# Patient Record
Sex: Female | Born: 1935 | Race: White | Hispanic: No | Marital: Married | Smoking: Former smoker
Health system: Southern US, Community
[De-identification: ages and names within clinical notes are randomized; demographics above are authoritative.]

## PROBLEM LIST (undated history)

## (undated) DIAGNOSIS — I499 Cardiac arrhythmia, unspecified: Secondary | ICD-10-CM

## (undated) DIAGNOSIS — M810 Age-related osteoporosis without current pathological fracture: Secondary | ICD-10-CM

## (undated) DIAGNOSIS — N200 Calculus of kidney: Secondary | ICD-10-CM

## (undated) DIAGNOSIS — E7800 Pure hypercholesterolemia, unspecified: Secondary | ICD-10-CM

## (undated) DIAGNOSIS — Z972 Presence of dental prosthetic device (complete) (partial): Secondary | ICD-10-CM

## (undated) DIAGNOSIS — K219 Gastro-esophageal reflux disease without esophagitis: Secondary | ICD-10-CM

## (undated) DIAGNOSIS — J449 Chronic obstructive pulmonary disease, unspecified: Secondary | ICD-10-CM

## (undated) DIAGNOSIS — I10 Essential (primary) hypertension: Secondary | ICD-10-CM

## (undated) HISTORY — DX: Cardiac arrhythmia, unspecified: I49.9

## (undated) HISTORY — PX: KIDNEY STONE SURGERY: SHX686

## (undated) HISTORY — PX: EYE SURGERY: SHX253

## (undated) HISTORY — PX: ABDOMINAL HYSTERECTOMY: SHX81

## (undated) HISTORY — PX: HEMORRHOID SURGERY: SHX153

## (undated) HISTORY — PX: THYROID SURGERY: SHX805

---

## 2004-07-12 ENCOUNTER — Emergency Department: Payer: Self-pay | Admitting: Emergency Medicine

## 2004-07-15 ENCOUNTER — Other Ambulatory Visit: Payer: Self-pay

## 2004-10-07 ENCOUNTER — Ambulatory Visit: Payer: Self-pay | Admitting: Unknown Physician Specialty

## 2004-10-20 ENCOUNTER — Ambulatory Visit: Payer: Self-pay | Admitting: Unknown Physician Specialty

## 2005-12-02 ENCOUNTER — Ambulatory Visit: Payer: Self-pay | Admitting: Unknown Physician Specialty

## 2007-01-25 ENCOUNTER — Ambulatory Visit: Payer: Self-pay | Admitting: Unknown Physician Specialty

## 2007-01-26 ENCOUNTER — Ambulatory Visit: Payer: Self-pay | Admitting: Unknown Physician Specialty

## 2007-03-28 ENCOUNTER — Ambulatory Visit: Payer: Self-pay | Admitting: General Surgery

## 2007-03-28 HISTORY — PX: COLONOSCOPY: SHX174

## 2007-06-16 ENCOUNTER — Ambulatory Visit: Payer: Self-pay | Admitting: Unknown Physician Specialty

## 2007-07-04 ENCOUNTER — Ambulatory Visit: Payer: Self-pay | Admitting: Internal Medicine

## 2007-11-10 ENCOUNTER — Ambulatory Visit: Payer: Self-pay | Admitting: Unknown Physician Specialty

## 2007-11-28 ENCOUNTER — Ambulatory Visit: Payer: Self-pay | Admitting: Unknown Physician Specialty

## 2008-01-01 ENCOUNTER — Ambulatory Visit: Payer: Self-pay | Admitting: Gastroenterology

## 2008-01-05 ENCOUNTER — Ambulatory Visit: Payer: Self-pay | Admitting: Gastroenterology

## 2008-05-16 ENCOUNTER — Ambulatory Visit: Payer: Self-pay | Admitting: Unknown Physician Specialty

## 2008-08-16 ENCOUNTER — Ambulatory Visit: Payer: Self-pay

## 2008-09-12 ENCOUNTER — Ambulatory Visit: Payer: Self-pay | Admitting: Orthopedic Surgery

## 2008-09-17 ENCOUNTER — Ambulatory Visit: Payer: Self-pay | Admitting: Orthopedic Surgery

## 2009-07-08 ENCOUNTER — Ambulatory Visit: Payer: Self-pay | Admitting: Unknown Physician Specialty

## 2010-09-02 ENCOUNTER — Ambulatory Visit: Payer: Self-pay | Admitting: Unknown Physician Specialty

## 2011-09-23 ENCOUNTER — Ambulatory Visit: Payer: Self-pay | Admitting: Unknown Physician Specialty

## 2011-10-04 ENCOUNTER — Ambulatory Visit: Payer: Self-pay | Admitting: Unknown Physician Specialty

## 2012-12-28 ENCOUNTER — Ambulatory Visit: Payer: Self-pay | Admitting: Unknown Physician Specialty

## 2013-10-02 ENCOUNTER — Encounter: Payer: Self-pay | Admitting: General Surgery

## 2013-10-02 ENCOUNTER — Ambulatory Visit (INDEPENDENT_AMBULATORY_CARE_PROVIDER_SITE_OTHER): Payer: Medicare Other | Admitting: General Surgery

## 2013-10-02 VITALS — BP 130/70 | HR 76 | Resp 14 | Ht 67.0 in | Wt 195.0 lb

## 2013-10-02 DIAGNOSIS — K625 Hemorrhage of anus and rectum: Secondary | ICD-10-CM | POA: Insufficient documentation

## 2013-10-02 DIAGNOSIS — K648 Other hemorrhoids: Secondary | ICD-10-CM

## 2013-10-02 LAB — HEMOCCULT GUIAC POC 1CARD (OFFICE): Fecal Occult Blood, POC: NEGATIVE

## 2013-10-02 NOTE — Patient Instructions (Addendum)
The patient is aware to call back for any questions or concerns. If further bleeding then colonoscopy

## 2013-10-02 NOTE — Progress Notes (Signed)
Patient ID: Danielle Ruiz, female   DOB: 1933/11/08, 79 y.o.   MRN: [id number]  Chief Complaint  Patient presents with  . Other    Old Patient evaluation of rectal bleeding     HPI Danielle Ruiz is a 79 y.o. female.  Here today to evaluate rectal bleeding. She had one episode of rectal bleeding last week. Bright red blood (painless) identified in the toilet bowl on one occasion. No recurrence.   No pain with defecation. Granddaughter is a physician in [facility] and wants her to have a colonoscopy. Bowels move daily. Denies any other gastrointestinal issues. She feels like it is hemorrhoids. Colonoscopy in 2008 was notable for a hyperplastic polyp. HPI  Past Medical History  Diagnosis Date  . Irregular heart beat     Past Surgical History  Procedure Laterality Date  . Kidney stone surgery    . Abdominal hysterectomy    . Thyroid surgery    . Colonoscopy  03-28-2007     Dr Lemar Livings hyperplastic polyp removed  . Hemorrhoid surgery      Family History  Problem Relation Age of Onset  . Cancer Brother 60    colon    Social History History  Substance Use Topics  . Smoking status: Former Games developer  . Smokeless tobacco: Not on file  . Alcohol Use: Yes     Comment: wine    Allergies not on file  Current Outpatient Prescriptions  Medication Sig Dispense Refill  . amLODipine (NORVASC) 5 MG tablet Take 5 mg by mouth daily.       . fluticasone (FLONASE) 50 MCG/ACT nasal spray Place 1 spray into both nostrils daily.       . furosemide (LASIX) 20 MG tablet Take 20 mg by mouth.       . propranolol ER (INDERAL LA) 80 MG 24 hr capsule Take 80 mg by mouth daily.        No current facility-administered medications for this visit.    Review of Systems Review of Systems  Constitutional: Negative.   Respiratory: Negative.   Cardiovascular: Negative.   Gastrointestinal: Positive for anal bleeding. Negative for nausea, vomiting, abdominal pain and diarrhea.    Blood pressure 130/70,  pulse 76, resp. rate 14, height 5\' 7"  (1.702 m), weight 195 lb (88.451 kg).  Physical Exam Physical Exam  Constitutional: She is oriented to person, place, and time. She appears well-developed and well-nourished.  Eyes: No scleral icterus.  Neck: Neck supple.  Cardiovascular: Normal rate, regular rhythm and normal heart sounds.   Mild lower leg edema, right > left leg right leg 43 cm and left leg 41 cm.  Pulmonary/Chest: Effort normal and breath sounds normal.  Abdominal: Soft.  Genitourinary: Rectal exam shows internal hemorrhoid. Guaiac negative stool.  Internal hemorrhoid at 10 o'clock   Lymphadenopathy:    She has no cervical adenopathy.  Neurological: She is alert and oriented to person, place, and time.  Skin: Skin is warm and dry.    Data Reviewed 2008 colonoscopy: Mild diverticulosis of the sigmoid colon. 5 mm hyperplastic polyp in the rectum.  On endoscopy showed a 1 cm internal hemorrhoid with evidence of recent bleeding on the right anterior lower rectal wall. Stool is Hemoccult negative. Visualize lower rectum mucosa was unremarkable.  Assessment    Internal hemorrhoids, resolved.     Plan    I offered to contact the patient's granddaughter and review the case with her. She declined this. Her granddaughter is welcome to call review  recommendations: Observation alone unless recurrent bleeding occurs. The patient was strongly encouraged to report any changes in her bowels or recurrent episodes of bleeding which would indeed warrant early colonoscopy.        Danielle Ruiz 10/02/2013, 9:23 PM

## 2013-10-22 ENCOUNTER — Encounter: Payer: Self-pay | Admitting: General Surgery

## 2013-12-31 ENCOUNTER — Ambulatory Visit: Payer: Self-pay | Admitting: Physician Assistant

## 2014-08-12 ENCOUNTER — Encounter: Payer: Self-pay | Admitting: General Surgery

## 2015-02-27 ENCOUNTER — Other Ambulatory Visit: Payer: Self-pay | Admitting: Physician Assistant

## 2015-02-27 DIAGNOSIS — Z1231 Encounter for screening mammogram for malignant neoplasm of breast: Secondary | ICD-10-CM

## 2015-03-14 ENCOUNTER — Ambulatory Visit
Admission: RE | Admit: 2015-03-14 | Discharge: 2015-03-14 | Disposition: A | Discharge status: Home / Self Care | Payer: Medicare Other | Source: Ambulatory Visit | Attending: Physician Assistant | Admitting: Physician Assistant

## 2015-03-14 ENCOUNTER — Other Ambulatory Visit: Payer: Self-pay | Admitting: Physician Assistant

## 2015-03-14 DIAGNOSIS — Z1231 Encounter for screening mammogram for malignant neoplasm of breast: Secondary | ICD-10-CM

## 2015-05-15 ENCOUNTER — Ambulatory Visit (INDEPENDENT_AMBULATORY_CARE_PROVIDER_SITE_OTHER)
Admission: RE | Admit: 2015-05-15 | Discharge: 2015-05-15 | Disposition: A | Discharge status: Home / Self Care | Payer: Medicare Other | Source: Ambulatory Visit | Attending: Family Medicine | Admitting: Family Medicine

## 2015-05-15 ENCOUNTER — Encounter: Payer: Self-pay | Admitting: Family Medicine

## 2015-05-15 ENCOUNTER — Ambulatory Visit (INDEPENDENT_AMBULATORY_CARE_PROVIDER_SITE_OTHER): Payer: Medicare Other | Admitting: Family Medicine

## 2015-05-15 VITALS — BP 126/74 | HR 69 | Ht 67.0 in | Wt 193.0 lb

## 2015-05-15 DIAGNOSIS — M170 Bilateral primary osteoarthritis of knee: Secondary | ICD-10-CM | POA: Diagnosis not present

## 2015-05-15 DIAGNOSIS — M25562 Pain in left knee: Secondary | ICD-10-CM | POA: Diagnosis not present

## 2015-05-15 DIAGNOSIS — M25561 Pain in right knee: Secondary | ICD-10-CM | POA: Diagnosis not present

## 2015-05-15 NOTE — Assessment & Plan Note (Signed)
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 We discussed proper shoewear, icing regimen, topical anti-inflammatory's, over-the-counter natural supplementations, as well as home exercises. Patient with x-rays today for more of a baseline. I expect patient to make improvement but we discussed that she will not be pain-free. If patient continues to have pain at follow-up in 4 weeks she would be a candidate for injections.

## 2015-05-15 NOTE — Patient Instructions (Addendum)
Good to see you.  Ice 20 minutes 2 times daily. Usually after activity and before bed. Exercises 3 times a week.  Vitamin D 2000 IU daily Turmeric  daily Fish oil 2 grams daily pennsaid pinkie amount topically 2 times daily as needed.  Wera good shoes Good shoes with rigid bottom.  Danielle Ruiz, [place]rell or New balance greater then 700 See me again in 4 weeks and we will make sure you are better

## 2015-05-15 NOTE — Progress Notes (Signed)
  Zach Zoee Heeney D.O. [facility] [address] [place], [place] [address] Phone: [phone] Subjective:    I'm seeing this patient by the request  of:  MCLAUGHLIN, MIRIAM K, MD   CC: Bilateral knee pain.   BJY:[id number] Lavenia P Arel is a 81 y.o. female coming in with complaint of bilateral knee pain.  Been a long insidious onset.  No injury.  No instability or radiation worse at night, 6/10 in severity.  Can be sharp or throbbing sensation. Pain on inside of knees bilaterally.  Mild improvement with over-the counter medications. Patient still can do all daily activities and this is not stopping her but she would like to slow down the progression so she does not have any knee replacement in the future.  Past Medical History  Diagnosis Date  . Irregular heart beat    Past Surgical History  Procedure Laterality Date  . Kidney stone surgery    . Abdominal hysterectomy    . Thyroid surgery    . Colonoscopy  03-28-2007     Dr Lemar Livings hyperplastic polyp removed  . Hemorrhoid surgery     History  Substance Use Topics  . Smoking status: Former Games developer  . Smokeless tobacco: Not on file  . Alcohol Use: Yes     Comment: wine   ;No Known Allergies Family History  Problem Relation Age of Onset  . Cancer Brother 59    colon       Past medical history, social, surgical and family history all reviewed in electronic medical record.   Review of Systems: No headache, visual changes, nausea, vomiting, diarrhea, constipation, dizziness, abdominal pain, skin rash, fevers, chills, night sweats, weight loss, swollen lymph nodes, body aches, joint swelling, muscle aches, chest pain, shortness of breath, mood changes.   Objective Blood pressure 126/74, pulse 69, height  (1.702 m), weight 193 lb (87.544 kg), SpO2 96 %.  General: No apparent distress alert and oriented x3 mood and affect normal, dressed appropriately.  HEENT: Pupils equal, extraocular movements intact    Respiratory: Patient's speak in full sentences and does not appear short of breath  Cardiovascular: No lower extremity edema, non tender, no erythema  Skin: Warm dry intact with no signs of infection or rash on extremities or on axial skeleton.  Abdomen: Soft nontender  Neuro: Cranial nerves II through XII are intact, neurovascularly intact in all extremities with 2+ DTRs and 2+ pulses.  Lymph: No lymphadenopathy of posterior or anterior cervical chain or axillae bilaterally.  Gait normal with good balance and coordination.  MSK:  Non tender with full range of motion and good stability and symmetric strength and tone of shoulders, elbows, wrist, hip, and ankles bilaterally.  Knee: Bilateral Normal to inspection with no erythema or effusion or obvious bony abnormalities. Tender palpation of the medial joint line ROM full in flexion and extension and lower leg rotation. Ligaments with solid consistent endpoints including ACL, PCL, LCL, MCL. Negative Mcmurray's, Apley's, and Thessalonian tests. Non painful patellar compression. Patellar glide without crepitus. Patellar and quadriceps tendons unremarkable. Hamstring and quadriceps strength is normal.      Impression and Recommendations:     Tawana Scaleedical decision making of moderate complexity.

## 2015-05-15 NOTE — Progress Notes (Signed)
Pre visit review using our clinic review tool, if applicable. No additional management support is needed unless otherwise documented below in the visit note. 

## 2015-06-02 ENCOUNTER — Encounter: Payer: Self-pay | Admitting: *Deleted

## 2015-06-03 NOTE — Discharge Instructions (Signed)
[facility] °[facility] ° °POST OPERATIVE INSTRUCTIONS FOR DR. TROXLER AND DR. FOWLER °[facility] ° ° °1. Take your medication as prescribed.  Pain medication should be taken only as needed. ° °2. Keep the dressing clean, dry and intact. ° °3. Keep your foot elevated above the heart level for the first 48 hours. ° °4. Walking to the bathroom and brief periods of walking are acceptable, unless we have instructed you to be non-weight bearing. ° °5. Always wear your post-op shoe when walking.  Always use your crutches if you are to be non-weight bearing. ° °6. Do not take a shower. Baths are permissible as long as the foot is kept out of the water.  ° °7. Every hour you are awake:  °- Bend your knee 15 times. °- Flex foot 15 times °- Massage calf 15 times ° °8. Call [facility] ([phone]) if any of the following problems occur: °- You develop a temperature or fever. °- The bandage becomes saturated with blood. °- Medication does not stop your pain. °- Injury of the foot occurs. °- Any symptoms of infection including redness, odor, or red streaks running from wound. °-  ° °General Anesthesia, Care After °Refer to this sheet in the next few weeks. These instructions provide you with information on caring for yourself after your procedure. Your health care provider may also give you more specific instructions. Your treatment has been planned according to current medical practices, but problems sometimes occur. Call your health care provider if you have any problems or questions after your procedure. °WHAT TO EXPECT AFTER THE PROCEDURE °After the procedure, it is typical to experience: °· Sleepiness. °· Nausea and vomiting. °HOME CARE INSTRUCTIONS °· For the first 24 hours after general anesthesia: °¨ Have a responsible person with you. °¨ Do not drive a car. If you are alone, do not take public transportation. °¨ Do not drink alcohol. °¨ Do not take medicine  that has not been prescribed by your health care provider. °¨ Do not sign important papers or make important decisions. °¨ You may resume a normal diet and activities as directed by your health care provider. °· Change bandages (dressings) as directed. °· If you have questions or problems that seem related to general anesthesia, call the hospital and ask for the anesthetist or anesthesiologist on call. °SEEK MEDICAL CARE IF: °· You have nausea and vomiting that continue the day after anesthesia. °· You develop a rash. °SEEK IMMEDIATE MEDICAL CARE IF:  °· You have difficulty breathing. °· You have chest pain. °· You have any allergic problems. °Document Released: 01/03/2001 Document Revised: 10/02/2013 Document Reviewed: 04/12/2013 °ExitCare® Patient Information ©2015 [employer]. This information is not intended to replace advice given to you by your health care provider. Make sure you discuss any questions you have with your health care provider. ° °

## 2015-06-05 ENCOUNTER — Ambulatory Visit
Admission: RE | Admit: 2015-06-05 | Discharge: 2015-06-05 | Disposition: A | Discharge status: Home / Self Care | Payer: Medicare Other | Source: Ambulatory Visit | Attending: Podiatry | Admitting: Podiatry

## 2015-06-05 ENCOUNTER — Ambulatory Visit: Payer: Medicare Other | Admitting: Anesthesiology

## 2015-06-05 ENCOUNTER — Encounter
Admission: RE | Disposition: A | Discharge status: Home / Self Care | Payer: Self-pay | Source: Ambulatory Visit | Attending: Podiatry

## 2015-06-05 DIAGNOSIS — Z87442 Personal history of urinary calculi: Secondary | ICD-10-CM | POA: Insufficient documentation

## 2015-06-05 DIAGNOSIS — E780 Pure hypercholesterolemia: Secondary | ICD-10-CM | POA: Insufficient documentation

## 2015-06-05 DIAGNOSIS — M2042 Other hammer toe(s) (acquired), left foot: Secondary | ICD-10-CM

## 2015-06-05 DIAGNOSIS — M2041 Other hammer toe(s) (acquired), right foot: Secondary | ICD-10-CM | POA: Diagnosis present

## 2015-06-05 DIAGNOSIS — K219 Gastro-esophageal reflux disease without esophagitis: Secondary | ICD-10-CM | POA: Diagnosis not present

## 2015-06-05 DIAGNOSIS — Z90710 Acquired absence of both cervix and uterus: Secondary | ICD-10-CM | POA: Insufficient documentation

## 2015-06-05 DIAGNOSIS — J449 Chronic obstructive pulmonary disease, unspecified: Secondary | ICD-10-CM | POA: Insufficient documentation

## 2015-06-05 DIAGNOSIS — E892 Postprocedural hypoparathyroidism: Secondary | ICD-10-CM | POA: Diagnosis not present

## 2015-06-05 DIAGNOSIS — G8929 Other chronic pain: Secondary | ICD-10-CM | POA: Insufficient documentation

## 2015-06-05 DIAGNOSIS — Z87891 Personal history of nicotine dependence: Secondary | ICD-10-CM | POA: Diagnosis not present

## 2015-06-05 DIAGNOSIS — I10 Essential (primary) hypertension: Secondary | ICD-10-CM | POA: Insufficient documentation

## 2015-06-05 DIAGNOSIS — Z79899 Other long term (current) drug therapy: Secondary | ICD-10-CM | POA: Insufficient documentation

## 2015-06-05 DIAGNOSIS — M810 Age-related osteoporosis without current pathological fracture: Secondary | ICD-10-CM | POA: Insufficient documentation

## 2015-06-05 DIAGNOSIS — Z888 Allergy status to other drugs, medicaments and biological substances status: Secondary | ICD-10-CM | POA: Insufficient documentation

## 2015-06-05 DIAGNOSIS — I493 Ventricular premature depolarization: Secondary | ICD-10-CM | POA: Insufficient documentation

## 2015-06-05 HISTORY — DX: Calculus of kidney: N20.0

## 2015-06-05 HISTORY — DX: Essential (primary) hypertension: I10

## 2015-06-05 HISTORY — DX: Presence of dental prosthetic device (complete) (partial): Z97.2

## 2015-06-05 HISTORY — DX: Cardiac arrhythmia, unspecified: I49.9

## 2015-06-05 HISTORY — DX: Pure hypercholesterolemia, unspecified: E78.00

## 2015-06-05 HISTORY — DX: Gastro-esophageal reflux disease without esophagitis: K21.9

## 2015-06-05 HISTORY — PX: HAMMER TOE SURGERY: SHX385

## 2015-06-05 HISTORY — DX: Chronic obstructive pulmonary disease, unspecified: J44.9

## 2015-06-05 HISTORY — DX: Age-related osteoporosis without current pathological fracture: M81.0

## 2015-06-05 SURGERY — CORRECTION, HAMMER TOE
Anesthesia: Monitor Anesthesia Care | Laterality: Left | Wound class: Clean

## 2015-06-05 MED ORDER — LACTATED RINGERS IV SOLN
INTRAVENOUS | Status: DC
  Administered 2015-06-05: 07:00:00 via INTRAVENOUS

## 2015-06-05 MED ORDER — HYDROCODONE-ACETAMINOPHEN 5-325 MG PO TABS: 1.0000 | ORAL_TABLET | Freq: Four times a day (QID) | ORAL | Status: DC | PRN

## 2015-06-05 MED ORDER — LIDOCAINE HCL (CARDIAC) 20 MG/ML IV SOLN
INTRAVENOUS | Status: DC | PRN
Start: 2015-06-05 — End: 2015-06-05
  Administered 2015-06-05: 40 mg via INTRAVENOUS

## 2015-06-05 MED ORDER — MIDAZOLAM HCL 2 MG/2ML IJ SOLN
INTRAMUSCULAR | Status: DC | PRN
  Administered 2015-06-05: 2 mg via INTRAVENOUS

## 2015-06-05 MED ORDER — PROPOFOL INFUSION 10 MG/ML OPTIME
INTRAVENOUS | Status: DC | PRN
  Administered 2015-06-05: 50 ug/kg/min via INTRAVENOUS

## 2015-06-05 MED ORDER — OXYCODONE HCL 5 MG PO TABS: 5.0000 mg | ORAL_TABLET | Freq: Once | ORAL | Status: DC | PRN

## 2015-06-05 MED ORDER — CEFAZOLIN SODIUM-DEXTROSE 2-3 GM-% IV SOLR
2.0000 g | Freq: Once | INTRAVENOUS | Status: AC
  Administered 2015-06-05: 2 g via INTRAVENOUS

## 2015-06-05 MED ORDER — OXYCODONE HCL 5 MG/5ML PO SOLN: 5.0000 mg | Freq: Once | ORAL | Status: DC | PRN

## 2015-06-05 MED ORDER — PROPOFOL 10 MG/ML IV BOLUS
INTRAVENOUS | Status: DC | PRN
  Administered 2015-06-05: 50 mg via INTRAVENOUS

## 2015-06-05 MED ORDER — BUPIVACAINE HCL (PF) 0.5 % IJ SOLN
INTRAMUSCULAR | Status: DC | PRN
  Administered 2015-06-05 (×2): 2 mL

## 2015-06-05 MED ORDER — FENTANYL CITRATE (PF) 100 MCG/2ML IJ SOLN
INTRAMUSCULAR | Status: DC | PRN
  Administered 2015-06-05 (×2): 50 ug via INTRAVENOUS

## 2015-06-05 SURGICAL SUPPLY — 66 items
BANDAGE ELASTIC 4 CLIP NS LF (GAUZE/BANDAGES/DRESSINGS) ×3 IMPLANT
BENZOIN TINCTURE PRP APPL 2/3 (GAUZE/BANDAGES/DRESSINGS) IMPLANT
BLADE CRESCENTIC (BLADE) IMPLANT
BLADE MED AGGRESSIVE (BLADE) IMPLANT
BLADE MINI RND TIP GREEN BEAV (BLADE) IMPLANT
BLADE OSC/SAGITTAL 5.5X25 (BLADE) IMPLANT
BLADE OSC/SAGITTAL MD 5.5X18 (BLADE) ×3 IMPLANT
BLADE OSC/SAGITTAL MD 9X18.5 (BLADE) IMPLANT
BLADE OSCILLATING/SAGITTAL (BLADE)
BLADE SURG 15 STRL LF DISP TIS (BLADE) IMPLANT
BLADE SURG 15 STRL SS (BLADE)
BLADE SW THK.38XMED LNG THN (BLADE) IMPLANT
BNDG ESMARK 4X12 TAN STRL LF (GAUZE/BANDAGES/DRESSINGS) ×3 IMPLANT
BNDG GAUZE 4.5X4.1 6PLY STRL (MISCELLANEOUS) ×3 IMPLANT
BNDG STRETCH 4X75 STRL LF (GAUZE/BANDAGES/DRESSINGS) ×3 IMPLANT
BUR EGG 4X8 MED (BURR) IMPLANT
BUR STRYKR EGG 5.0 (BURR) IMPLANT
CANISTER SUCT 1200ML W/VALVE (MISCELLANEOUS) ×3 IMPLANT
CAST PADDING 3X4FT ST 30246 (SOFTGOODS)
CLOSURE WOUND 1/4X4 (GAUZE/BANDAGES/DRESSINGS)
COVER PIN YLW 0.028-062 (MISCELLANEOUS) IMPLANT
CUFF TOURN SGL QUICK 18 (TOURNIQUET CUFF) ×3 IMPLANT
DRAPE FLUOR MINI C-ARM 54X84 (DRAPES) ×3 IMPLANT
DRILL WIRE PASS (DRILL) IMPLANT
DURAPREP 26ML APPLICATOR (WOUND CARE) ×3 IMPLANT
ETHIBOND 2 0 GREEN CT 2 30IN (SUTURE) IMPLANT
FIXATION HAMMERTOE ANGLD 15MM (Toe) ×1 IMPLANT
GAUZE PETRO XEROFOAM 1X8 (MISCELLANEOUS) ×3 IMPLANT
GAUZE PETRO XEROFOAM 5X9 (MISCELLANEOUS) IMPLANT
GAUZE SPONGE 4X4 12PLY STRL (GAUZE/BANDAGES/DRESSINGS) ×3 IMPLANT
GLOVE BIO SURGEON STRL SZ7.5 (GLOVE) ×3 IMPLANT
GLOVE BIO SURGEON STRL SZ8 (GLOVE) ×3 IMPLANT
GLOVE INDICATOR 8.0 STRL GRN (GLOVE) ×3 IMPLANT
GOWN STRL REUS W/ TWL XL LVL3 (GOWN DISPOSABLE) ×2 IMPLANT
GOWN STRL REUS W/TWL XL LVL3 (GOWN DISPOSABLE) ×4
HAMMERTOE ANGLED 15MM 5MM (Toe) ×3 IMPLANT
K-WIRE DBL END TROCAR 6X.045 (WIRE)
K-WIRE DBL END TROCAR 6X.062 (WIRE)
KWIRE DBL END TROCAR 6X.045 (WIRE) IMPLANT
KWIRE DBL END TROCAR 6X.062 (WIRE) IMPLANT
NEEDLE HYPO 18GX1.5 BLUNT FILL (NEEDLE) ×3 IMPLANT
NEEDLE HYPO 25GX1X1/2 BEV (NEEDLE) ×3 IMPLANT
PACK EXTREMITY ARMC (MISCELLANEOUS) ×3 IMPLANT
PAD CAST CTTN 3X4 STRL (SOFTGOODS) IMPLANT
PAD GROUND ADULT SPLIT (MISCELLANEOUS) ×3 IMPLANT
RASP SM TEAR CROSS CUT (RASP) ×3 IMPLANT
SPLINT CAST 1 STEP 4X30 (MISCELLANEOUS) ×3 IMPLANT
SPLINT FAST PLASTER 5X30 (CAST SUPPLIES)
SPLINT PLASTER CAST FAST 5X30 (CAST SUPPLIES) IMPLANT
STOCKINETTE STRL 6IN 960660 (GAUZE/BANDAGES/DRESSINGS) ×3 IMPLANT
STRIP CLOSURE SKIN 1/4X4 (GAUZE/BANDAGES/DRESSINGS) IMPLANT
SUT ETHILON 4-0 (SUTURE)
SUT ETHILON 4-0 FS2 18XMFL BLK (SUTURE)
SUT ETHILON 5-0 FS-2 18 BLK (SUTURE) ×3 IMPLANT
SUT VIC AB 1 CT1 36 (SUTURE) IMPLANT
SUT VIC AB 2-0 CT1 27 (SUTURE)
SUT VIC AB 2-0 CT1 TAPERPNT 27 (SUTURE) IMPLANT
SUT VIC AB 2-0 SH 27 (SUTURE)
SUT VIC AB 2-0 SH 27XBRD (SUTURE) IMPLANT
SUT VIC AB 3-0 SH 27 (SUTURE)
SUT VIC AB 3-0 SH 27X BRD (SUTURE) IMPLANT
SUT VIC AB 4-0 FS2 27 (SUTURE) ×3 IMPLANT
SUT VICRYL AB 3-0 FS1 BRD 27IN (SUTURE) IMPLANT
SUTURE ETHLN 4-0 FS2 18XMF BLK (SUTURE) IMPLANT
SYRINGE 10CC LL (SYRINGE) ×3 IMPLANT
WATER STERILE IRR 1000ML POUR (IV SOLUTION) ×3 IMPLANT

## 2015-06-05 NOTE — Op Note (Signed)
Operative note   Surgeon: Dr. Recardo Evangelist, DPM.    Assistant: None    Preop diagnosis: Hammertoe deformity second toe left foot    Postop diagnosis: Same    Procedure:   1. Hammertoe correction second toe left foot with PIPJ fusion using hammerlock implant           EBL: Negligible less than 5 cc    Anesthesia:IV sedation with local block    Hemostasis: Ankle tourniquet 250 mmHg pressure    Specimen: None    Complications: None    Operative indications: Chronic pain and structural deformity to the left second toe unresponsive to conservative care    Procedure:  Patient was brought into the OR and placed on the operating table in thesupine position. After anesthesia was obtained theleft lower extremity was prepped and draped in usual sterile fashion.  Operative Report: At this time attention directed to the second toe of the left foot where 2 semielliptical incisions were made over the PIP joint. This ellipse of skin was then removed and the extensor tendon was identified and incised transversely and reflected proximally. The extensor tendon over the middle phalanx was dissected and retracted distally to expose the articular cartilage on the middle phalanx. This point a power saw was used to remove the articular cartilage from both the proximal phalanx head and middle phalanx base. At this time the area was irrigated checked FluoroScan good positioning location of the cuts were noted. The medullary canals for both the middle phalanx and proximal phalanx were then prepared utilizing materials from the hammerlock set. Once this was achieved the implant was placed and proximally released and then fitted to the middle phalanx. Care was taken at this time to make sure no soft tissue impingement was in between the bone portions. There is checked FluoroScan good position correction were noted implant position was excellent. After She irrigation the extensor tendon was then reapproximated  with 4 Vicryl simple interrupted sutures. Skin closed with 5-0 nylon horizontal mattress sutures. Toes checked this point seen to sit in a much more rectus alignment. The level of position of the toes was pretty accurate 1 through 5 with second toe sitting up slightly higher. I feel like once she starts to utilize the toe more with the fusion the flexor tendon will help bring the toe down in a much better position. At this time there is black 0.5% Marcaine plain and a sterile compressive dressing was placed across wound consisting of  Xeroform gauze 4 x 4's Kling and Kerlix.     Patient tolerated the procedure and anesthesia well.  Was transported from the OR to the PACU with all vital signs stable and vascular status intact. To be discharged per routine protocol.  Will follow up in approximately 1 week in the outpatient clinic.

## 2015-06-05 NOTE — Anesthesia Preprocedure Evaluation (Signed)
Anesthesia Evaluation  Patient identified by MRN, date of birth, ID band  Reviewed: NPO status   History of Anesthesia Complications Negative for: history of anesthetic complications  Airway Mallampati: II  TM Distance: >3 FB Neck ROM: full    Dental  (+) Upper Dentures, Lower Dentures   Pulmonary neg COPD (denies)former smoker,    Pulmonary exam normal       Cardiovascular Exercise Tolerance: Good hypertension, Normal cardiovascular exam+ dysrhythmias (pvc)     Neuro/Psych Anxiety negative neurological ROS     GI/Hepatic Neg liver ROS, GERD-  Controlled,  Endo/Other  negative endocrine ROS  Renal/GU Renal disease (kidney stones)  negative genitourinary   Musculoskeletal   Abdominal   Peds  Hematology negative hematology ROS (+)   Anesthesia Other Findings   Reproductive/Obstetrics                             Anesthesia Physical Anesthesia Plan  ASA: II  Anesthesia Plan: MAC   Post-op Pain Management:    Induction:   Airway Management Planned:   Additional Equipment:   Intra-op Plan:   Post-operative Plan:   Informed Consent: I have reviewed the patients History and Physical, chart, labs and discussed the procedure including the risks, benefits and alternatives for the proposed anesthesia with the patient or authorized representative who has indicated his/her understanding and acceptance.     Plan Discussed with: CRNA  Anesthesia Plan Comments:         Anesthesia Quick Evaluation

## 2015-06-05 NOTE — Anesthesia Postprocedure Evaluation (Signed)
  Anesthesia Post-op Note  Patient: Danielle Ruiz  Procedure(s) Performed: Procedure(s) with comments: HAMMER TOE CORRECTION WITH TENDON LENGTHENING (Left) - SEDATION WITH LOCAL  Anesthesia type:MAC  Patient location: PACU  Post pain: Pain level controlled  Post assessment: Post-op Vital signs reviewed, Patient's Cardiovascular Status Stable, Respiratory Function Stable, Patent Airway and No signs of Nausea or vomiting  Post vital signs: Reviewed and stable  Last Vitals:  Filed Vitals:   06/05/15 0856  BP:   Pulse: 63  Temp:   Resp: 19    Level of consciousness: awake, alert  and patient cooperative  Complications: No apparent anesthesia complications

## 2015-06-05 NOTE — Transfer of Care (Signed)
Immediate Anesthesia Transfer of Care Note  Patient: Danielle Ruiz  Procedure(s) Performed: Procedure(s) with comments: HAMMER TOE CORRECTION WITH TENDON LENGTHENING (Left) - SEDATION WITH LOCAL  Patient Location: PACU  Anesthesia Type: MAC  Level of Consciousness: awake, alert  and patient cooperative  Airway and Oxygen Therapy: Patient Spontanous Breathing and Patient connected to supplemental oxygen  Post-op Assessment: Post-op Vital signs reviewed, Patient's Cardiovascular Status Stable, Respiratory Function Stable, Patent Airway and No signs of Nausea or vomiting  Post-op Vital Signs: Reviewed and stable  Complications: No apparent anesthesia complications

## 2015-06-05 NOTE — Anesthesia Procedure Notes (Signed)
Procedure Name: MAC Performed by: Ishia Tenorio Pre-anesthesia Checklist: Patient identified, Emergency Drugs available, Suction available, Timeout performed and Patient being monitored Patient Re-evaluated:Patient Re-evaluated prior to inductionOxygen Delivery Method: Simple face mask Placement Confirmation: positive ETCO2       

## 2015-06-06 ENCOUNTER — Encounter: Payer: Self-pay | Admitting: Podiatry

## 2015-06-11 ENCOUNTER — Ambulatory Visit: Payer: Medicare Other | Admitting: Family Medicine

## 2015-06-12 ENCOUNTER — Ambulatory Visit: Payer: Medicare Other | Admitting: Family Medicine

## 2015-10-16 DIAGNOSIS — J310 Chronic rhinitis: Secondary | ICD-10-CM | POA: Insufficient documentation

## 2016-04-05 DIAGNOSIS — M20031 Swan-neck deformity of right finger(s): Secondary | ICD-10-CM | POA: Insufficient documentation

## 2016-06-08 ENCOUNTER — Other Ambulatory Visit: Payer: Self-pay | Admitting: Physician Assistant

## 2016-06-08 DIAGNOSIS — Z1231 Encounter for screening mammogram for malignant neoplasm of breast: Secondary | ICD-10-CM

## 2016-06-25 ENCOUNTER — Ambulatory Visit
Admission: RE | Admit: 2016-06-25 | Discharge: 2016-06-25 | Disposition: A | Discharge status: Home / Self Care | Payer: Medicare PPO | Source: Ambulatory Visit | Attending: Physician Assistant | Admitting: Physician Assistant

## 2016-06-25 ENCOUNTER — Other Ambulatory Visit: Payer: Self-pay | Admitting: Physician Assistant

## 2016-06-25 DIAGNOSIS — Z1231 Encounter for screening mammogram for malignant neoplasm of breast: Secondary | ICD-10-CM | POA: Insufficient documentation

## 2016-07-16 ENCOUNTER — Other Ambulatory Visit: Payer: Self-pay | Admitting: Orthopedic Surgery

## 2016-07-16 DIAGNOSIS — M79644 Pain in right finger(s): Secondary | ICD-10-CM

## 2016-07-27 ENCOUNTER — Other Ambulatory Visit: Payer: Medicare Other

## 2016-07-27 ENCOUNTER — Ambulatory Visit
Admission: RE | Admit: 2016-07-27 | Discharge: 2016-07-27 | Disposition: A | Discharge status: Home / Self Care | Payer: Medicare PPO | Source: Ambulatory Visit | Attending: Orthopedic Surgery | Admitting: Orthopedic Surgery

## 2016-07-27 DIAGNOSIS — M79644 Pain in right finger(s): Secondary | ICD-10-CM

## 2017-01-06 IMAGING — MR MR [PERSON_NAME]*[PERSON_NAME]* W/O CM
5 series · 40 of 40 positions shown · non-contrast
Comparison: None.

CLINICAL DATA: Right middle finger pain. Unable to bend the finger.

EXAM:
MRI OF THE RIGHT FINGERS WITHOUT CONTRAST
TECHNIQUE: Multiplanar, multisequence MR imaging was performed. No intravenous
contrast was administered.

[Series 4: T2 fat-sat · axial · right · 4.0mm · 0.31mm/px · z∈[-59,+60]mm · 8 of 32 slices shown (1 of 3)]
[im 1/32]
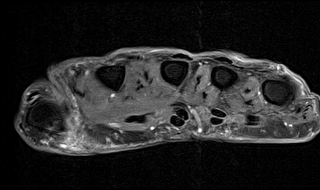
[im 5/32]
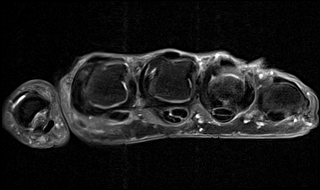
[im 9/32]
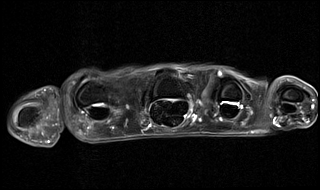
[im 14/32]
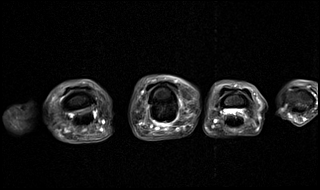
[im 18/32]
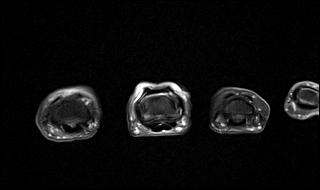
[im 23/32]
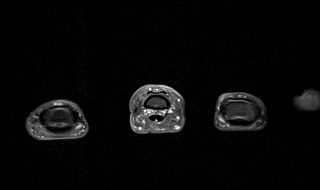
[im 27/32]
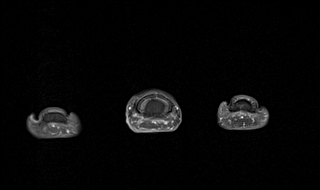
[im 32/32]
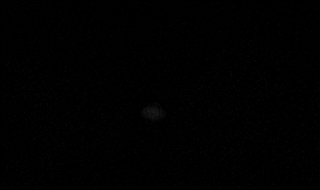

[Series 5: T2 fat-sat · coronal · right · 1.0mm · 0.23mm/px · 7 of 24 slices shown (2 of 3)]
[im 1/24]
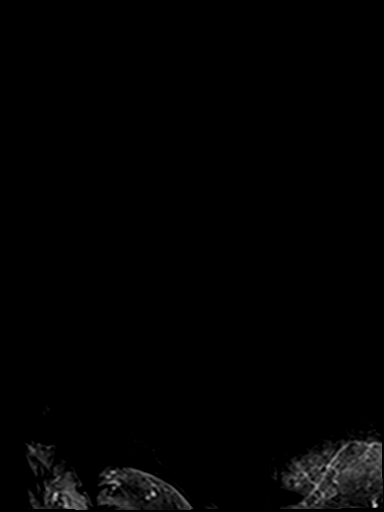
[im 4/24]
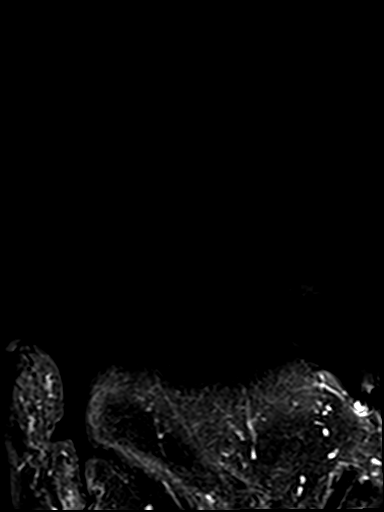
[im 8/24]
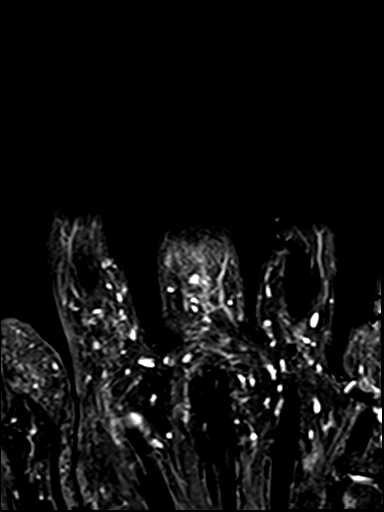
[im 12/24]
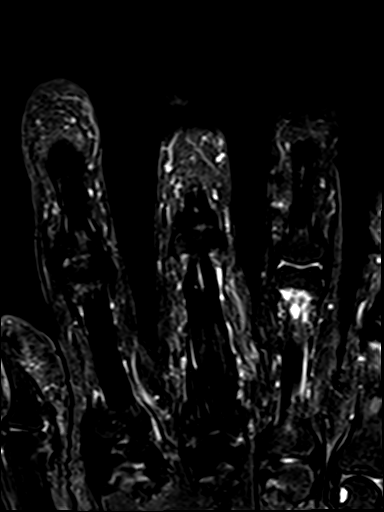
[im 16/24]
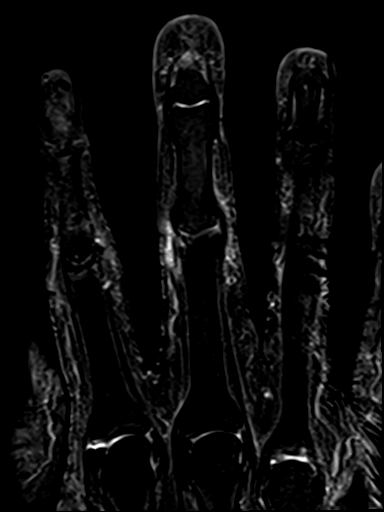
[im 20/24]
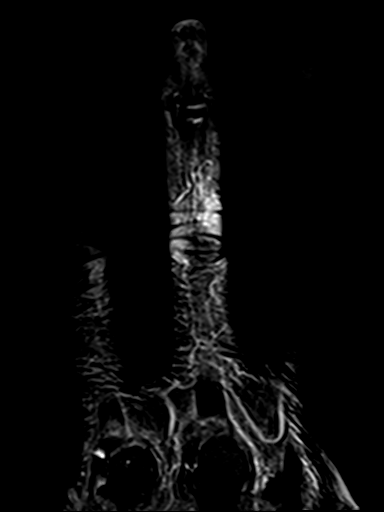
[im 24/24]
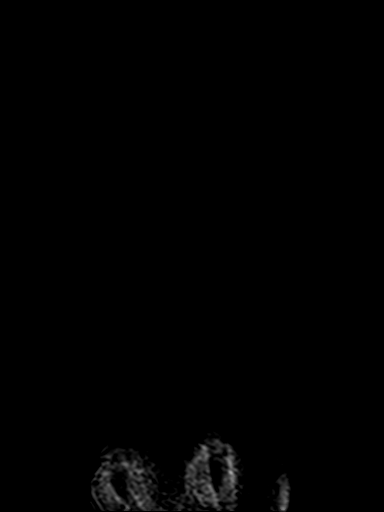

[Series 6: PD fat-sat · sagittal · right · 1.0mm · 0.51mm/px · 8 of 29 slices shown]
[im 1/29]
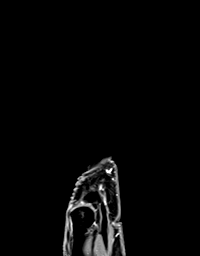
[im 5/29]
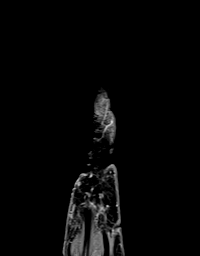
[im 9/29]
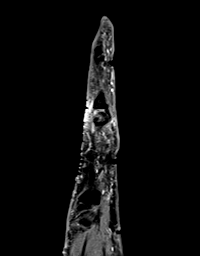
[im 13/29]
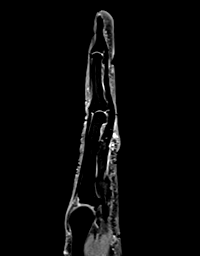
[im 17/29]
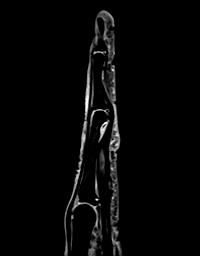
[im 21/29]
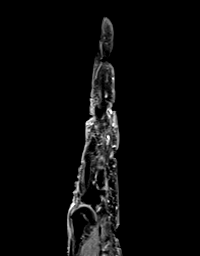
[im 25/29]
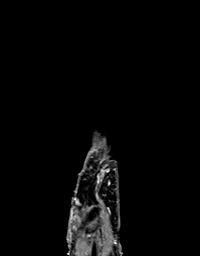
[im 29/29]
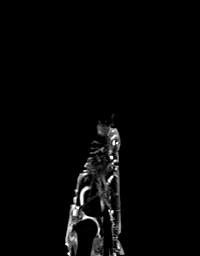

[Series 7: t1_ax · axial · right · 4.0mm · 0.31mm/px · z∈[-59,+60]mm · 9 of 32 slices shown]
[im 1/32]
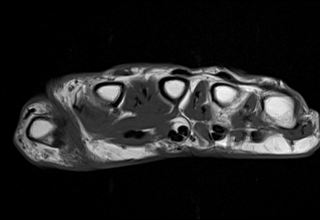
[im 4/32]
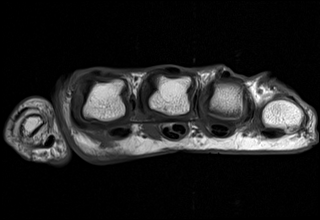
[im 8/32]
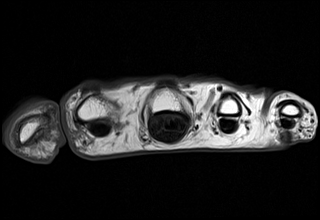
[im 12/32]
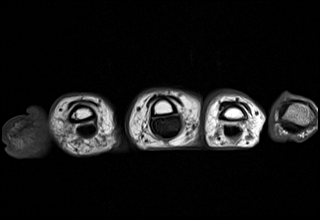
[im 16/32]
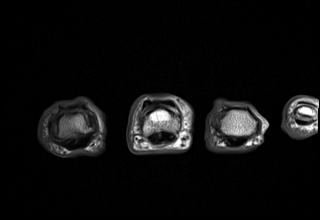
[im 20/32]
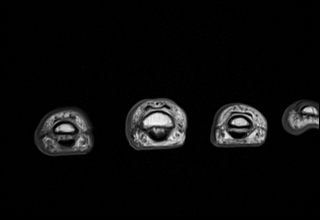
[im 24/32]
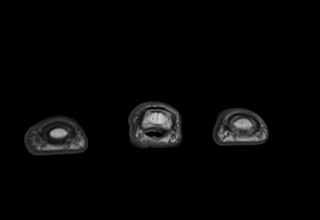
[im 28/32]
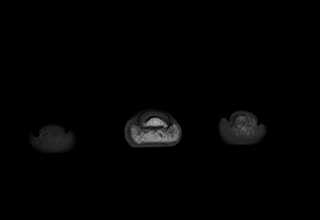
[im 32/32]
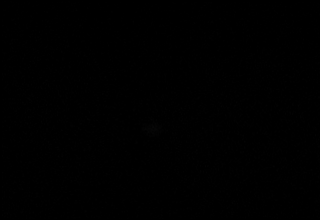

[Series 8: T2 fat-sat · sagittal · right · 1.0mm · 0.25mm/px · 8 of 29 slices shown (3 of 3)]
[im 1/29]
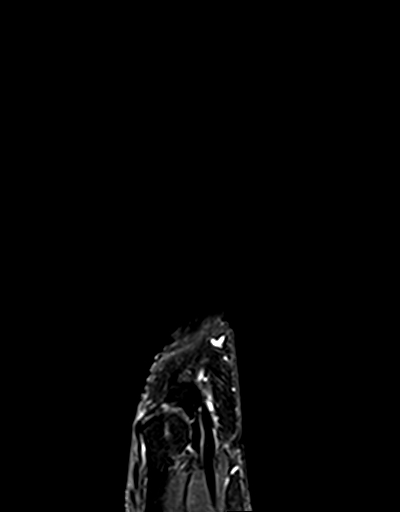
[im 5/29]
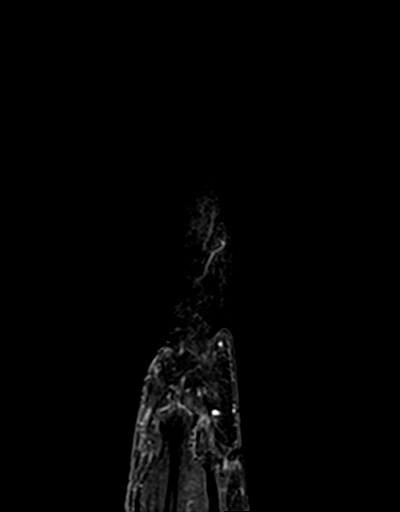
[im 9/29]
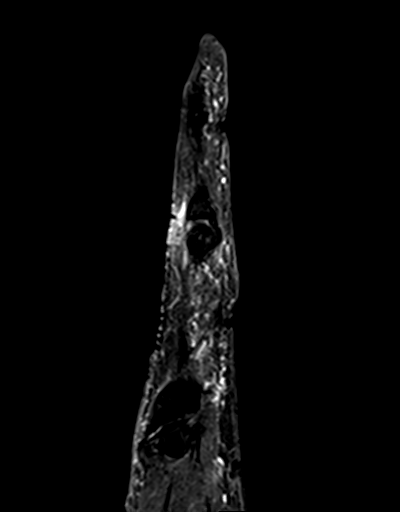
[im 13/29]
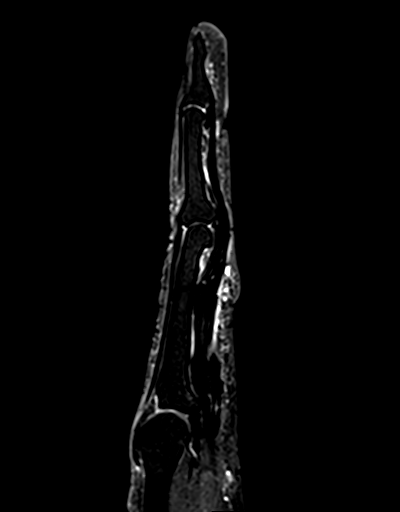
[im 17/29]
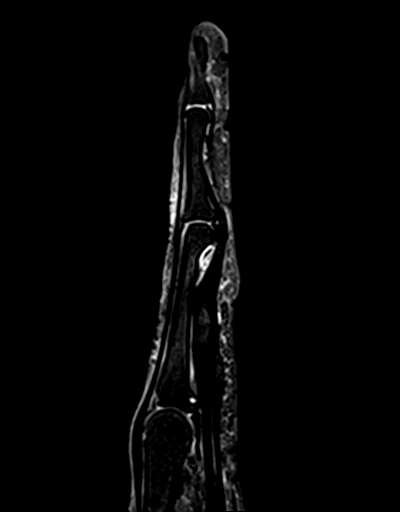
[im 21/29]
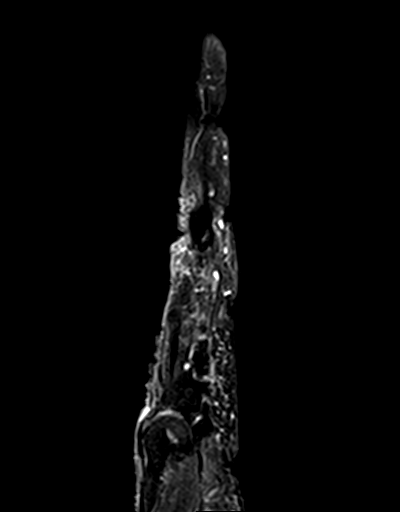
[im 25/29]
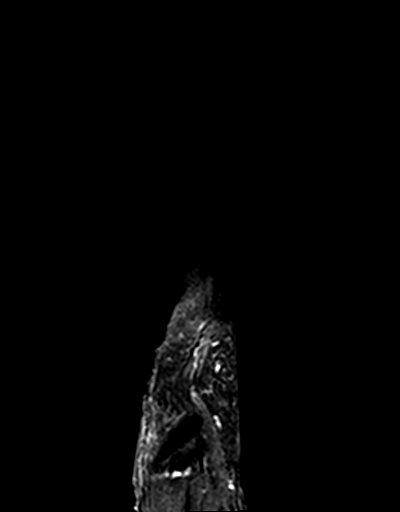
[im 29/29]
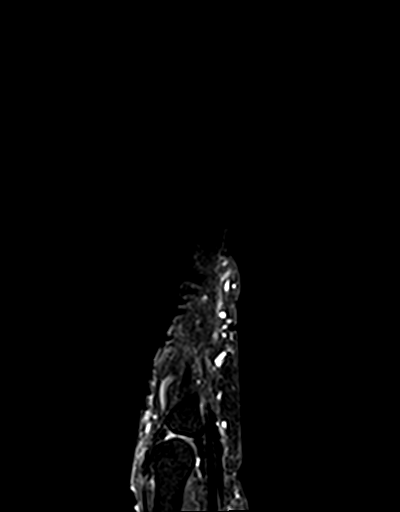

[40 of 40 positions shown; findings below may reference images not displayed]

FINDINGS: Bones/Joint/Cartilage

No marrow signal abnormality. No fracture or dislocation. Normal
alignment. No joint effusion.

Ligaments

Collateral ligaments are intact.

Muscles and Tendons
Extensor compartment tendons are normal. Severe tendinosis of the
flexor digitorum superficialis of the third digit at the level of
the third proximal phalanx. Focal severe tendinosis of the flexor
digitorum profundus at the level of the base of the for third
proximal phalanx.

Soft tissue
No fluid collection or hematoma.  No soft tissue mass.
IMPRESSION: 1. Severe tendinosis of the flexor digitorum superficialis of the
third digit at the level of the third proximal phalanx. Focal severe
tendinosis of the flexor digitorum profundus at the level of the
base of the for third proximal phalanx. No complete tear of the
flexor compartment tendons.

## 2017-05-30 ENCOUNTER — Other Ambulatory Visit: Payer: Self-pay | Admitting: Physician Assistant

## 2017-06-01 ENCOUNTER — Other Ambulatory Visit: Payer: Self-pay | Admitting: Physician Assistant

## 2017-06-01 DIAGNOSIS — Z1239 Encounter for other screening for malignant neoplasm of breast: Secondary | ICD-10-CM

## 2017-06-29 ENCOUNTER — Ambulatory Visit
Admission: RE | Admit: 2017-06-29 | Discharge: 2017-06-29 | Disposition: A | Discharge status: Home / Self Care | Payer: Medicare HMO | Source: Ambulatory Visit | Attending: Physician Assistant | Admitting: Physician Assistant

## 2017-06-29 DIAGNOSIS — Z1231 Encounter for screening mammogram for malignant neoplasm of breast: Secondary | ICD-10-CM | POA: Insufficient documentation

## 2017-06-29 DIAGNOSIS — Z1239 Encounter for other screening for malignant neoplasm of breast: Secondary | ICD-10-CM

## 2017-07-11 ENCOUNTER — Other Ambulatory Visit: Payer: Self-pay | Admitting: Physician Assistant

## 2017-07-11 DIAGNOSIS — R928 Other abnormal and inconclusive findings on diagnostic imaging of breast: Secondary | ICD-10-CM

## 2017-07-11 DIAGNOSIS — N6489 Other specified disorders of breast: Secondary | ICD-10-CM

## 2017-07-14 ENCOUNTER — Ambulatory Visit
Admission: RE | Admit: 2017-07-14 | Discharge: 2017-07-14 | Disposition: A | Discharge status: Home / Self Care | Payer: Medicare HMO | Source: Ambulatory Visit | Attending: Physician Assistant | Admitting: Physician Assistant

## 2017-07-14 DIAGNOSIS — N6489 Other specified disorders of breast: Secondary | ICD-10-CM | POA: Diagnosis not present

## 2017-07-14 DIAGNOSIS — R928 Other abnormal and inconclusive findings on diagnostic imaging of breast: Secondary | ICD-10-CM

## 2017-10-20 ENCOUNTER — Other Ambulatory Visit: Payer: Self-pay | Admitting: Physician Assistant

## 2017-10-20 DIAGNOSIS — M5416 Radiculopathy, lumbar region: Secondary | ICD-10-CM

## 2017-10-20 DIAGNOSIS — M79605 Pain in left leg: Secondary | ICD-10-CM

## 2017-11-10 ENCOUNTER — Other Ambulatory Visit: Payer: Self-pay

## 2017-11-10 ENCOUNTER — Emergency Department
Admission: EM | Admit: 2017-11-10 | Discharge: 2017-11-10 | Disposition: A | Discharge status: Home / Self Care | Payer: Medicare HMO | Attending: Emergency Medicine | Admitting: Emergency Medicine

## 2017-11-10 DIAGNOSIS — T6291XA Toxic effect of unspecified noxious substance eaten as food, accidental (unintentional), initial encounter: Secondary | ICD-10-CM | POA: Diagnosis not present

## 2017-11-10 DIAGNOSIS — J449 Chronic obstructive pulmonary disease, unspecified: Secondary | ICD-10-CM | POA: Diagnosis not present

## 2017-11-10 DIAGNOSIS — IMO0001 Reserved for inherently not codable concepts without codable children: Secondary | ICD-10-CM

## 2017-11-10 DIAGNOSIS — Z79899 Other long term (current) drug therapy: Secondary | ICD-10-CM | POA: Insufficient documentation

## 2017-11-10 DIAGNOSIS — Z7982 Long term (current) use of aspirin: Secondary | ICD-10-CM | POA: Diagnosis not present

## 2017-11-10 DIAGNOSIS — Z87891 Personal history of nicotine dependence: Secondary | ICD-10-CM | POA: Diagnosis not present

## 2017-11-10 DIAGNOSIS — I10 Essential (primary) hypertension: Secondary | ICD-10-CM | POA: Insufficient documentation

## 2017-11-10 LAB — URINALYSIS, COMPLETE (UACMP) WITH MICROSCOPIC
BILIRUBIN URINE: NEGATIVE
Bacteria, UA: NONE SEEN
GLUCOSE, UA: 50 mg/dL — AB
KETONES UR: 5 mg/dL — AB
LEUKOCYTES UA: NEGATIVE
NITRITE: NEGATIVE
PH: 5 (ref 5.0–8.0)
Protein, ur: NEGATIVE mg/dL
Specific Gravity, Urine: 1.019 (ref 1.005–1.030)

## 2017-11-10 LAB — BASIC METABOLIC PANEL
ANION GAP: 10 (ref 5–15)
BUN: 24 mg/dL — ABNORMAL HIGH (ref 6–20)
CALCIUM: 8.7 mg/dL — AB (ref 8.9–10.3)
CO2: 21 mmol/L — ABNORMAL LOW (ref 22–32)
Chloride: 108 mmol/L (ref 101–111)
Creatinine, Ser: 0.59 mg/dL (ref 0.44–1.00)
GFR calc Af Amer: >60 mL/min (ref >60)
GLUCOSE: 218 mg/dL — AB (ref 65–99)
Potassium: 3.7 mmol/L (ref 3.5–5.1)
Sodium: 139 mmol/L (ref 135–145)

## 2017-11-10 LAB — CBC WITH DIFFERENTIAL/PLATELET
BASOS ABS: 0 10*3/uL (ref 0–0.1)
BASOS PCT: 0 %
EOS ABS: 0.1 10*3/uL (ref 0–0.7)
EOS PCT: 1 %
HCT: 44.4 % (ref 35.0–47.0)
Hemoglobin: 14.9 g/dL (ref 12.0–16.0)
LYMPHS PCT: 4 %
Lymphs Abs: 0.4 10*3/uL — ABNORMAL LOW (ref 1.0–3.6)
MCH: 31.4 pg (ref 26.0–34.0)
MCHC: 33.5 g/dL (ref 32.0–36.0)
MCV: 93.7 fL (ref 80.0–100.0)
Monocytes Absolute: 0.6 10*3/uL (ref 0.2–0.9)
Monocytes Relative: 6 %
Neutro Abs: 8 10*3/uL — ABNORMAL HIGH (ref 1.4–6.5)
Neutrophils Relative %: 89 %
PLATELETS: 194 10*3/uL (ref 150–440)
RBC: 4.74 MIL/uL (ref 3.80–5.20)
RDW: 13.8 % (ref 11.5–14.5)
WBC: 9 10*3/uL (ref 3.6–11.0)

## 2017-11-10 LAB — HEPATIC FUNCTION PANEL
ALT: 20 U/L (ref 14–54)
AST: 33 U/L (ref 15–41)
Albumin: 3.6 g/dL (ref 3.5–5.0)
Alkaline Phosphatase: 76 U/L (ref 38–126)
BILIRUBIN DIRECT: 0.2 mg/dL (ref 0.1–0.5)
BILIRUBIN INDIRECT: 0.7 mg/dL (ref 0.3–0.9)
Total Bilirubin: 0.9 mg/dL (ref 0.3–1.2)
Total Protein: 7.4 g/dL (ref 6.5–8.1)

## 2017-11-10 LAB — LIPASE, BLOOD: Lipase: 76 U/L — ABNORMAL HIGH (ref 11–51)

## 2017-11-10 LAB — TROPONIN I

## 2017-11-10 MED ORDER — CIPROFLOXACIN HCL 500 MG PO TABS
750.0000 mg | ORAL_TABLET | Freq: Once | ORAL | Status: AC
  Administered 2017-11-10: 750 mg via ORAL
  Filled 2017-11-10: qty 2

## 2017-11-10 MED ORDER — CIPROFLOXACIN HCL 500 MG PO TABS: 500.0000 mg | ORAL_TABLET | Freq: Two times a day (BID) | ORAL | 0 refills | Status: AC

## 2017-11-10 MED ORDER — HALOPERIDOL LACTATE 5 MG/ML IJ SOLN
2.5000 mg | Freq: Once | INTRAMUSCULAR | Status: AC
  Administered 2017-11-10: 2.5 mg via INTRAVENOUS
  Filled 2017-11-10: qty 1

## 2017-11-10 MED ORDER — LOPERAMIDE HCL 2 MG PO CAPS
4.0000 mg | ORAL_CAPSULE | Freq: Once | ORAL | Status: AC
Start: 2017-11-10 — End: 2017-11-10
  Administered 2017-11-10: 4 mg via ORAL
  Filled 2017-11-10: qty 2

## 2017-11-10 MED ORDER — ONDANSETRON HCL 4 MG/2ML IJ SOLN
4.0000 mg | Freq: Once | INTRAMUSCULAR | Status: AC
  Administered 2017-11-10: 4 mg via INTRAVENOUS
  Filled 2017-11-10: qty 2

## 2017-11-10 MED ORDER — LOPERAMIDE HCL 2 MG PO CAPS
2.0000 mg | ORAL_CAPSULE | Freq: Once | ORAL | Status: AC
  Administered 2017-11-10: 2 mg via ORAL
  Filled 2017-11-10: qty 1

## 2017-11-10 MED ORDER — ONDANSETRON 4 MG PO TBDP: 4.0000 mg | ORAL_TABLET | Freq: Three times a day (TID) | ORAL | 0 refills | Status: DC | PRN

## 2017-11-10 MED ORDER — ONDANSETRON HCL 4 MG/2ML IJ SOLN
4.0000 mg | Freq: Once | INTRAMUSCULAR | Status: AC
  Administered 2017-11-10: 4 mg via INTRAVENOUS

## 2017-11-10 MED ORDER — SODIUM CHLORIDE 0.9 % IV BOLUS (SEPSIS)
1000.0000 mL | Freq: Once | INTRAVENOUS | Status: AC
  Administered 2017-11-10: 1000 mL via INTRAVENOUS

## 2017-11-10 MED ORDER — ONDANSETRON HCL 4 MG/2ML IJ SOLN
INTRAMUSCULAR | Status: AC
  Filled 2017-11-10: qty 2

## 2017-11-10 MED ORDER — METOCLOPRAMIDE HCL 10 MG PO TABS: 10.0000 mg | ORAL_TABLET | Freq: Three times a day (TID) | ORAL | 0 refills | Status: DC

## 2017-11-10 MED ORDER — ONDANSETRON HCL 4 MG/2ML IJ SOLN: 4.0000 mg | Freq: Once | INTRAMUSCULAR | Status: DC

## 2017-11-10 NOTE — ED Triage Notes (Signed)
Pt in with persistent n.v.d since 2300, no abd pain.

## 2017-11-10 NOTE — ED Provider Notes (Signed)
[facility] Emergency Department Provider Note  ____________________________________________   First MD Initiated Contact with Patient 11/10/17 0401     (approximate)  I have reviewed the triage vital signs and the nursing notes.   HISTORY  Chief Complaint Emesis   HPI Danielle Ruiz is a 84 y.o. female comes to the emergency department with several hours of nausea vomiting and diarrhea.  Symptoms began suddenly around 11:30 PM.  Her grandson has identical symptoms.  The 2 of them ate lunch at the same restaurant roughly 10 hours prior to the event.  Her symptoms were sudden onset severe.  Nothing seems to make them better or worse.  She has severe cramping upper abdominal pain that is slightly worse after vomiting.  Denies fevers or chills.  She denies recent antibiotics.  Past Medical History:  Diagnosis Date  . COPD (chronic obstructive pulmonary disease) (HCC)    on chest xray  . Dysrhythmia    PVCs  . GERD (gastroesophageal reflux disease)   . Hypercholesteremia   . Hypertension   . Irregular heart beat   . Kidney stones   . Osteoporosis   . Wears dentures    full upper and lower    Patient Active Problem List   Diagnosis Date Noted  . Degenerative arthritis of knee, bilateral 05/15/2015  . Internal hemorrhoid 10/02/2013  . Rectal bleeding 10/02/2013    Past Surgical History:  Procedure Laterality Date  . ABDOMINAL HYSTERECTOMY    . COLONOSCOPY  03-28-2007    Dr Lemar Livings hyperplastic polyp removed  . EYE SURGERY Bilateral    cataracts  . HAMMER TOE SURGERY Left 06/05/2015   Procedure: HAMMER TOE CORRECTION WITH TENDON LENGTHENING;  Surgeon: Recardo Evangelist, DPM;  Location: [facility];  Service: Podiatry;  Laterality: Left;  SEDATION WITH LOCAL  . HEMORRHOID SURGERY    . KIDNEY STONE SURGERY    . THYROID SURGERY      Prior to Admission medications   Medication Sig Start Date End Date Taking? Authorizing Provider    amLODipine (NORVASC) 5 MG tablet Take 5 mg by mouth daily.  09/25/13   [provider]  aspirin 81 MG tablet Take 81 mg by mouth daily.    [provider]  Cholecalciferol (VITAMIN D-3 PO) Take by mouth.    [provider]  ciprofloxacin (CIPRO) 500 MG tablet Take 1 tablet (500 mg total) by mouth 2 (two) times daily for 3 days. 11/10/17 11/13/17  Merrily Brittle, MD  Cyanocobalamin (VITAMIN B-12 PO) Take by mouth.    [provider]  diazepam (VALIUM) 5 MG tablet Take 5 mg by mouth every 6 (six) hours as needed for anxiety.    [provider]  fluticasone (FLONASE) 50 MCG/ACT nasal spray Place 1 spray into both nostrils daily.  09/13/13   [provider]  furosemide (LASIX) 20 MG tablet Take 20 mg by mouth.  09/25/13   [provider]  HYDROcodone-acetaminophen (NORCO) 5-325 MG per tablet Take 1 tablet by mouth every 6 (six) hours as needed for moderate pain. 06/05/15   Recardo Evangelist, DPM  Omega-3 Fatty Acids (OMEGA 3 PO) Take by mouth.    [provider]  ondansetron (ZOFRAN ODT) 4 MG disintegrating tablet Take 1 tablet (4 mg total) by mouth every 8 (eight) hours as needed for nausea or vomiting. 11/10/17   Merrily Brittle, MD  potassium chloride (KLOR-CON) 8 MEQ tablet Take 8 mEq by mouth daily as needed.  [provider]  propranolol ER (INDERAL LA) 80 MG 24 hr capsule Take 80 mg by mouth daily.  09/25/13   [provider]    Allergies Zetia [ezetimibe]  Family History  Problem Relation Age of Onset  . Cancer Brother 4248       colon    Social History Social History   Tobacco Use  . Smoking status: Former Smoker    Last attempt to quit: 10/11/1994    Years since quitting: 23.0  Substance Use Topics  . Alcohol use: Yes    Comment: wine  . Drug use: No    Review of Systems Constitutional: No fever/chills Eyes: No visual changes. ENT: No sore throat. Cardiovascular: Denies chest  pain. Respiratory: Denies shortness of breath. Gastrointestinal: Positive for abdominal pain.  Positive for nausea, positive for vomiting.  Positive for diarrhea.  No constipation. Genitourinary: Negative for dysuria. Musculoskeletal: Negative for back pain. Skin: Negative for rash. Neurological: Negative for headaches, focal weakness or numbness.   ____________________________________________   PHYSICAL EXAM:  VITAL SIGNS: ED Triage Vitals [11/10/17 0354]  Enc Vitals Group     BP (!) 151/78     Pulse Rate (!) 122     Resp 20     Temp      Temp src      SpO2 96 %     Weight 200 lb (90.7 kg)     Height 5\' 7"  (1.702 m)     Head Circumference      Peak Flow      Pain Score      Pain Loc      Pain Edu?      Excl. in GC?     Constitutional: Alert and oriented x4 appears quite uncomfortable retching and vomiting Eyes: PERRL EOMI. Head: Atraumatic. Nose: No congestion/rhinnorhea. Mouth/Throat: No trismus Neck: No stridor.   Cardiovascular: Tachycardic rate, regular rhythm. Grossly normal heart sounds.  Good peripheral circulation. Respiratory: Normal respiratory effort.  No retractions. Lungs CTAB and moving good air Gastrointestinal: Soft abdomen diffusely tender with no rebound no guarding no peritonitis.  Hyperactive bowel sounds Musculoskeletal: No lower extremity edema   Neurologic:  Normal speech and language. No gross focal neurologic deficits are appreciated. Skin:  Skin is warm, dry and intact. No rash noted. Psychiatric: Mood and affect are normal. Speech and behavior are normal.    ____________________________________________   DIFFERENTIAL includes but not limited to  Food poisoning, gastroenteritis, appendicitis, diverticulitis, dehydration ____________________________________________   LABS (all labs ordered are listed, but only abnormal results are displayed)  Labs Reviewed  BASIC METABOLIC PANEL - Abnormal; Notable for the following components:       Result Value   CO2 21 (*)    Glucose, Bld 218 (*)    BUN 24 (*)    Calcium 8.7 (*)    All other components within normal limits  LIPASE, BLOOD - Abnormal; Notable for the following components:   Lipase 76 (*)    All other components within normal limits  CBC WITH DIFFERENTIAL/PLATELET - Abnormal; Notable for the following components:   Neutro Abs 8.0 (*)    Lymphs Abs 0.4 (*)    All other components within normal limits  URINALYSIS, COMPLETE (UACMP) WITH MICROSCOPIC - Abnormal; Notable for the following components:   Color, Urine YELLOW (*)    APPearance HAZY (*)    Glucose, UA 50 (*)    Hgb urine dipstick SMALL (*)    Ketones, ur 5 (*)  Squamous Epithelial / LPF 0-5 (*)    All other components within normal limits  HEPATIC FUNCTION PANEL  TROPONIN I    Lab work reviewed by me with some ketosis consistent with starvation. __________________________________________  EKG   ____________________________________________  RADIOLOGY   ____________________________________________   PROCEDURES  Procedure(s) performed: no  Procedures  Critical Care performed: no  Observation: no ____________________________________________   INITIAL IMPRESSION / ASSESSMENT AND PLAN / ED COURSE  Pertinent labs & imaging results that were available during my care of the patient were reviewed by me and considered in my medical decision making (see chart for details).  The patient arrives tachycardic uncomfortable appearing with a tender abdomen nauseated vomiting with diarrhea.  Her grandson has identical symptoms that began several hours after eating at the same restaurant.  This is most consistent with a preformed toxin.  She feels improved after multiple doses of IV antiemetics and IV fluids and she is able to eat and keep food down.  We will discharge her with a short course of loperamide as well as Zofran as well as 3 days of ciprofloxacin in case this is bacterial gastroenteritis.   Discharged home in improved condition she verbalizes understanding and agreement with the plan.      ____________________________________________   FINAL CLINICAL IMPRESSION(S) / ED DIAGNOSES  Final diagnoses:  Food poisoning, accidental or unintentional, initial encounter      NEW MEDICATIONS STARTED DURING THIS VISIT:  New Prescriptions   CIPROFLOXACIN (CIPRO) 500 MG TABLET    Take 1 tablet (500 mg total) by mouth 2 (two) times daily for 3 days.   ONDANSETRON (ZOFRAN ODT) 4 MG DISINTEGRATING TABLET    Take 1 tablet (4 mg total) by mouth every 8 (eight) hours as needed for nausea or vomiting.     Note:  This document was prepared using Dragon voice recognition software and may include unintentional dictation errors.     Merrily Brittle, MD 11/10/17 [id number]

## 2017-11-10 NOTE — Discharge Instructions (Signed)
Please take your Zofran and loperamide as needed for severe symptoms and make sure you remain well-hydrated.  Return to the emergency department for any concerns such as fevers, chills, if you cannot keep food down, or for any other issues whatsoever.  It was a pleasure to take care of you today, and thank you for coming to our emergency department.  If you have any questions or concerns before leaving please ask the nurse to grab me and I'm more than happy to go through your aftercare instructions again.  If you were prescribed any opioid pain medication today such as Norco, Vicodin, Percocet, morphine, hydrocodone, or oxycodone please make sure you do not drive when you are taking this medication as it can alter your ability to drive safely.  If you have any concerns once you are home that you are not improving or are in fact getting worse before you can make it to your follow-up appointment, please do not hesitate to call 911 and come back for further evaluation.  Merrily BrittleNeil Hiep Ollis, MD  Results for orders placed or performed during the hospital encounter of 11/10/17  Basic metabolic panel  Result Value Ref Range   Sodium 139 135 - 145 mmol/L   Potassium 3.7 3.5 - 5.1 mmol/L   Chloride 108 101 - 111 mmol/L   CO2 21 (L) 22 - 32 mmol/L   Glucose, Bld 218 (H) 65 - 99 mg/dL   BUN 24 (H) 6 - 20 mg/dL   Creatinine, Ser 1.190.59 0.44 - 1.00 mg/dL   Calcium 8.7 (L) 8.9 - 10.3 mg/dL   GFR calc non Af Amer >60 >60 mL/min   GFR calc Af Amer >60 >60 mL/min   Anion gap 10 5 - 15  Hepatic function panel  Result Value Ref Range   Total Protein 7.4 6.5 - 8.1 g/dL   Albumin 3.6 3.5 - 5.0 g/dL   AST 33 15 - 41 U/L   ALT 20 14 - 54 U/L   Alkaline Phosphatase 76 38 - 126 U/L   Total Bilirubin 0.9 0.3 - 1.2 mg/dL   Bilirubin, Direct 0.2 0.1 - 0.5 mg/dL   Indirect Bilirubin 0.7 0.3 - 0.9 mg/dL  Lipase, blood  Result Value Ref Range   Lipase 76 (H) 11 - 51 U/L  Troponin I  Result Value Ref Range   Troponin I <0.03 <0.03 ng/mL  CBC with Differential  Result Value Ref Range   WBC 9.0 3.6 - 11.0 K/uL   RBC 4.74 3.80 - 5.20 MIL/uL   Hemoglobin 14.9 12.0 - 16.0 g/dL   HCT 14.744.4 82.935.0 - 56.247.0 %   MCV 93.7 80.0 - 100.0 fL   MCH 31.4 26.0 - 34.0 pg   MCHC 33.5 32.0 - 36.0 g/dL   RDW 13.013.8 86.511.5 - 78.414.5 %   Platelets 194 150 - 440 K/uL   Neutrophils Relative % 89 %   Neutro Abs 8.0 (H) 1.4 - 6.5 K/uL   Lymphocytes Relative 4 %   Lymphs Abs 0.4 (L) 1.0 - 3.6 K/uL   Monocytes Relative 6 %   Monocytes Absolute 0.6 0.2 - 0.9 K/uL   Eosinophils Relative 1 %   Eosinophils Absolute 0.1 0 - 0.7 K/uL   Basophils Relative 0 %   Basophils Absolute 0.0 0 - 0.1 K/uL

## 2018-02-06 ENCOUNTER — Encounter (INDEPENDENT_AMBULATORY_CARE_PROVIDER_SITE_OTHER): Payer: Medicare Other | Admitting: Vascular Surgery

## 2018-02-07 ENCOUNTER — Ambulatory Visit (INDEPENDENT_AMBULATORY_CARE_PROVIDER_SITE_OTHER): Payer: Medicare HMO | Admitting: Vascular Surgery

## 2018-02-07 ENCOUNTER — Encounter (INDEPENDENT_AMBULATORY_CARE_PROVIDER_SITE_OTHER): Payer: Self-pay | Admitting: Vascular Surgery

## 2018-02-07 VITALS — BP 137/88 | HR 82 | Resp 15 | Ht 65.5 in | Wt 196.0 lb

## 2018-02-07 DIAGNOSIS — R600 Localized edema: Secondary | ICD-10-CM | POA: Diagnosis not present

## 2018-02-07 DIAGNOSIS — M79604 Pain in right leg: Secondary | ICD-10-CM | POA: Diagnosis not present

## 2018-02-07 DIAGNOSIS — M79605 Pain in left leg: Secondary | ICD-10-CM

## 2018-02-07 NOTE — Progress Notes (Signed)
Subjective:    Patient ID: Danielle Ruiz, female    DOB: [birth year], 84 y.o.   MRN: [id number] Chief Complaint  Patient presents with  . New Patient (Initial Visit)    Having pain in left leg   Presents as a new patient referred by Dr. Jilda Roche for evaluation of bilateral lower extremity pain and edema.  Patient notes her symptoms have been present for "a few months".  The patient notes that her symptoms have progressively worsened.  The patient reports either thigh or calf pain which usually starts toward the evening hours and is not always associated with activity.  The patient denies any claudication type symptoms or rest pain.  The patient denies any ulceration to the bilateral lower extremity.  The patient does experience bilateral lower extremity edema.  This edema is also associated with some discomfort.  At this time, the patient does not engage in conservative therapy including wearing medical grade 1 compression socks and elevating her legs on a daily basis.  The patient denies any recent surgery or trauma to the bilateral lower extremity.  The patient denies any fever, nausea vomiting  Review of Systems  Constitutional: Negative.   HENT: Negative.   Eyes: Negative.   Respiratory: Negative.   Cardiovascular: Positive for leg swelling.       Lower extremity pain  Gastrointestinal: Negative.   Endocrine: Negative.   Genitourinary: Negative.   Musculoskeletal: Negative.   Skin: Negative.   Allergic/Immunologic: Negative.   Neurological: Negative.   Hematological: Negative.   Psychiatric/Behavioral: Negative.       Objective:   Physical Exam  Constitutional: She appears well-developed and well-nourished. No distress.  HENT:  Head: Normocephalic and atraumatic.  Right Ear: External ear normal.  Left Ear: External ear normal.  Eyes: Pupils are equal, round, and reactive to light. Conjunctivae and EOM are normal.  Neck: Normal range of motion.  Cardiovascular: Normal  rate, regular rhythm, normal heart sounds and intact distal pulses.  Pulses:      Radial pulses are 2+ on the right side, and 2+ on the left side.  Hard to palpate pedal pulses due to body habitus and edema however the bilateral feet are warm  Pulmonary/Chest: Effort normal and breath sounds normal.  Musculoskeletal: Normal range of motion. She exhibits edema (Mild to moderate nonpitting edema noted bilaterally).  Skin: Skin is warm and dry. She is not diaphoretic.  The diffuse less than 1 cm varicosities noted to the bilateral lower extremity.  There is no stasis dermatitis, skin changes, cellulitis or ulcerations noted to the bilateral lower extremity.  Psychiatric: She has a normal mood and affect. Her behavior is normal. Judgment and thought content normal.  Vitals reviewed.  BP 137/88 (BP Location: Right Arm, Patient Position: Sitting)   Pulse 82   Resp 15   Ht 5' 5.5" (1.664 m)   Wt 196 lb (88.9 kg)   BMI 32.12 kg/m   Past Medical History:  Diagnosis Date  . COPD (chronic obstructive pulmonary disease) (HCC)    on chest xray  . Dysrhythmia    PVCs  . GERD (gastroesophageal reflux disease)   . Hypercholesteremia   . Hypertension   . Irregular heart beat   . Kidney stones   . Osteoporosis   . Wears dentures    full upper and lower   Social History   Socioeconomic History  . Marital status: Married    Spouse name: Not on file  . Number of children: Not  on file  . Years of education: Not on file  . Highest education level: Not on file  Occupational History  . Not on file  Social Needs  . Financial resource strain: Not on file  . Food insecurity:    Worry: Not on file    Inability: Not on file  . Transportation needs:    Medical: Not on file    Non-medical: Not on file  Tobacco Use  . Smoking status: Former Smoker    Last attempt to quit: 10/11/1994    Years since quitting: 23.3  . Smokeless tobacco: Never Used  Substance and Sexual Activity  . Alcohol use:  Yes    Comment: wine  . Drug use: No  . Sexual activity: Not on file  Lifestyle  . Physical activity:    Days per week: Not on file    Minutes per session: Not on file  . Stress: Not on file  Relationships  . Social connections:    Talks on phone: Not on file    Gets together: Not on file    Attends religious service: Not on file    Active member of club or organization: Not on file    Attends meetings of clubs or organizations: Not on file    Relationship status: Not on file  . Intimate partner violence:    Fear of current or ex partner: Not on file    Emotionally abused: Not on file    Physically abused: Not on file    Forced sexual activity: Not on file  Other Topics Concern  . Not on file  Social History Narrative  . Not on file   Past Surgical History:  Procedure Laterality Date  . ABDOMINAL HYSTERECTOMY    . COLONOSCOPY  03-28-2007    Dr Lemar Livings hyperplastic polyp removed  . EYE SURGERY Bilateral    cataracts  . HAMMER TOE SURGERY Left 06/05/2015   Procedure: HAMMER TOE CORRECTION WITH TENDON LENGTHENING;  Surgeon: Recardo Evangelist, DPM;  Location: [facility];  Service: Podiatry;  Laterality: Left;  SEDATION WITH LOCAL  . HEMORRHOID SURGERY    . KIDNEY STONE SURGERY    . THYROID SURGERY     Family History  Problem Relation Age of Onset  . Cancer Brother 48       colon   Allergies  Allergen Reactions  . Zetia [Ezetimibe] Other (See Comments)    Muscle pain      Assessment & Plan:  Presents as a new patient referred by Dr. Jilda Roche for evaluation of bilateral lower extremity pain and edema.  Patient notes her symptoms have been present for "a few months".  The patient notes that her symptoms have progressively worsened.  The patient reports either thigh or calf pain which usually starts toward the evening hours and is not always associated with activity.  The patient denies any claudication type symptoms or rest pain.  The patient denies any ulceration  to the bilateral lower extremity.  The patient does experience bilateral lower extremity edema.  This edema is also associated with some discomfort.  At this time, the patient does not engage in conservative therapy including wearing medical grade 1 compression socks and elevating her legs on a daily basis.  The patient denies any recent surgery or trauma to the bilateral lower extremity.  The patient denies any fever, nausea vomiting.  1. Bilateral lower extremity edema - New The patient was encouraged to wear graduated compression stockings (20-30 mmHg) on a  daily basis. The patient was instructed to begin wearing the stockings first thing in the morning and removing them in the evening. The patient was instructed specifically not to sleep in the stockings. Prescription given.  In addition, behavioral modification including elevation during the day will be initiated. Anti-inflammatories for pain. The patient will follow up in one months to asses conservative management.  Information on chronic venous insufficiency and compression stockings was given to the patient. The patient was instructed to call the office in the interim if any worsening edema or ulcerations to the legs, feet or toes occurs. The patient expresses their understanding.  - VAS [place] LOWER EXTREMITY VENOUS REFLUX; Future  2. Lower extremity pain, bilateral - New Patient with multiple risk factors for peripheral artery disease Unable to palpate pedal pulses on exam Progressively worsening lower extremity discomfort I will bring the patient back to undergo a bilateral ABI to assess for any contributing peripheral artery disease that may be associated with her discomfort. Patient to remain abstinent of tobacco use. I have discussed with the patient at length the risk factors for and pathogenesis of atherosclerotic disease and encouraged a healthy diet, regular exercise regimen and blood pressure / glucose control.  The patient was  encouraged to call the office in the interim if he experiences any claudication like symptoms, rest pain or ulcers to his feet / toes.  - VAS [place] ABI WITH/WO TBI; Future  Current Outpatient Medications on File Prior to Visit  Medication Sig Dispense Refill  . amLODipine (NORVASC) 5 MG tablet Take 5 mg by mouth daily.     Marland Kitchen aspirin 81 MG tablet Take 81 mg by mouth daily.    . Cholecalciferol (VITAMIN D-3 PO) Take by mouth.    . Cyanocobalamin (VITAMIN B-12 PO) Take by mouth.    . diazepam (VALIUM) 5 MG tablet Take 5 mg by mouth every 6 (six) hours as needed for anxiety.    . fluticasone (FLONASE) 50 MCG/ACT nasal spray Place 1 spray into both nostrils daily.     . furosemide (LASIX) 20 MG tablet Take 20 mg by mouth.     . metoCLOPramide (REGLAN) 10 MG tablet Take 1 tablet (10 mg total) by mouth 3 (three) times daily with meals. 90 tablet 0  . metoprolol succinate (TOPROL-XL) 25 MG 24 hr tablet Take by mouth.    . Omega-3 Fatty Acids (OMEGA 3 PO) Take by mouth.    . potassium chloride (KLOR-CON) 8 MEQ tablet Take 8 mEq by mouth daily as needed.    Marland Kitchen HYDROcodone-acetaminophen (NORCO) 5-325 MG per tablet Take 1 tablet by mouth every 6 (six) hours as needed for moderate pain. (Patient not taking: Reported on 02/07/2018) 30 tablet 0  . ondansetron (ZOFRAN ODT) 4 MG disintegrating tablet Take 1 tablet (4 mg total) by mouth every 8 (eight) hours as needed for nausea or vomiting. (Patient not taking: Reported on 02/07/2018) 20 tablet 0  . propranolol ER (INDERAL LA) 80 MG 24 hr capsule Take 80 mg by mouth daily.     Marland Kitchen triamcinolone cream (KENALOG) 0.1 % Apply topically.     No current facility-administered medications on file prior to visit.    There are no Patient Instructions on file for this visit. No follow-ups on file.  Jayion Schneck A Hoy Fallert, PA-C

## 2018-03-24 ENCOUNTER — Encounter (INDEPENDENT_AMBULATORY_CARE_PROVIDER_SITE_OTHER): Payer: Medicare Other

## 2018-03-24 ENCOUNTER — Ambulatory Visit (INDEPENDENT_AMBULATORY_CARE_PROVIDER_SITE_OTHER): Payer: Medicare Other | Admitting: Vascular Surgery

## 2018-05-03 ENCOUNTER — Ambulatory Visit (INDEPENDENT_AMBULATORY_CARE_PROVIDER_SITE_OTHER): Payer: Medicare HMO

## 2018-05-03 ENCOUNTER — Encounter (INDEPENDENT_AMBULATORY_CARE_PROVIDER_SITE_OTHER): Payer: Self-pay | Admitting: Vascular Surgery

## 2018-05-03 ENCOUNTER — Ambulatory Visit (INDEPENDENT_AMBULATORY_CARE_PROVIDER_SITE_OTHER): Payer: Medicare HMO | Admitting: Vascular Surgery

## 2018-05-03 VITALS — BP 160/88 | HR 74 | Resp 16 | Ht 65.5 in | Wt 196.8 lb

## 2018-05-03 DIAGNOSIS — M170 Bilateral primary osteoarthritis of knee: Secondary | ICD-10-CM | POA: Diagnosis not present

## 2018-05-03 DIAGNOSIS — I890 Lymphedema, not elsewhere classified: Secondary | ICD-10-CM

## 2018-05-03 DIAGNOSIS — R600 Localized edema: Secondary | ICD-10-CM | POA: Diagnosis not present

## 2018-05-03 DIAGNOSIS — M79605 Pain in left leg: Secondary | ICD-10-CM | POA: Diagnosis not present

## 2018-05-03 DIAGNOSIS — M79604 Pain in right leg: Secondary | ICD-10-CM | POA: Diagnosis not present

## 2018-05-03 NOTE — Progress Notes (Signed)
Subjective:    Patient ID: Danielle Ruiz, female    DOB: 13-Mar-1934, 84 y.o.   MRN: [id number] Chief Complaint  Patient presents with  . Follow-up    32month abi,bil ven reflux   The patient presents to review vascular studies.  The patient was initially seen on February 07, 2018 for evaluation of bilateral lower extremity edema and discomfort. Since our initial visit, the patient has been engaging in conservative therapy including wearing medical grade 1 compression socks and elevating her legs on a daily basis.  The patient notes an improvement in her lower extremity edema and discomfort since initiating conservative therapy.  The patient denies any claudication-like symptoms, rest pain or ulcer formation to the bilateral lower extremity.  Patient underwent a bilateral ABI which was notable for right: 1.14 and left: 1.06.  Resting bilateral ankle brachial indices were within normal range.  No evidence of bilateral lower extremity arterial disease.  The patient underwent a bilateral lower extremity venous reflux which was notable for no evidence of deep vein thrombosis bilaterally.  No venous reflux seen in the deep or superficial system.  Patient denies any fever, nausea vomiting.  Review of Systems  Constitutional: Negative.   HENT: Negative.   Eyes: Negative.   Respiratory: Negative.   Cardiovascular: Negative.   Gastrointestinal: Negative.   Endocrine: Negative.   Genitourinary: Negative.   Musculoskeletal: Negative.   Skin: Negative.   Allergic/Immunologic: Negative.   Neurological: Negative.   Hematological: Negative.   Psychiatric/Behavioral: Negative.       Objective:   Physical Exam  Constitutional: She is oriented to person, place, and time. She appears well-developed and well-nourished. No distress.  HENT:  Head: Normocephalic and atraumatic.  Right Ear: External ear normal.  Left Ear: External ear normal.  Eyes: Pupils are equal, round, and reactive to light.  Conjunctivae are normal.  Neck: Normal range of motion.  Cardiovascular: Normal rate, regular rhythm, normal heart sounds and intact distal pulses.  Pulses:      Radial pulses are 2+ on the right side, and 2+ on the left side.       Dorsalis pedis pulses are 1+ on the right side, and 1+ on the left side.       Posterior tibial pulses are 1+ on the right side, and 1+ on the left side.  Pulmonary/Chest: Effort normal and breath sounds normal.  Musculoskeletal: Normal range of motion. She exhibits edema (Mild nonpitting bilateral lower extremity edema noted).  Neurological: She is alert and oriented to person, place, and time.  Skin: Skin is warm and dry. She is not diaphoretic.  Psychiatric: She has a normal mood and affect. Her behavior is normal. Judgment and thought content normal.  Vitals reviewed.  BP (!) 160/88 (BP Location: Right Arm)   Pulse 74   Resp 16   Ht 5' 5.5" (1.664 m)   Wt 196 lb 12.8 oz (89.3 kg)   BMI 32.25 kg/m   Past Medical History:  Diagnosis Date  . COPD (chronic obstructive pulmonary disease) (HCC)    on chest xray  . Dysrhythmia    PVCs  . GERD (gastroesophageal reflux disease)   . Hypercholesteremia   . Hypertension   . Irregular heart beat   . Kidney stones   . Osteoporosis   . Wears dentures    full upper and lower   Social History   Socioeconomic History  . Marital status: Married    Spouse name: Not on file  .  Number of children: Not on file  . Years of education: Not on file  . Highest education level: Not on file  Occupational History  . Not on file  Social Needs  . Financial resource strain: Not on file  . Food insecurity:    Worry: Not on file    Inability: Not on file  . Transportation needs:    Medical: Not on file    Non-medical: Not on file  Tobacco Use  . Smoking status: Former Smoker    Last attempt to quit: 10/11/1994    Years since quitting: 23.5  . Smokeless tobacco: Never Used  Substance and Sexual Activity  .  Alcohol use: Yes    Comment: wine  . Drug use: No  . Sexual activity: Not on file  Lifestyle  . Physical activity:    Days per week: Not on file    Minutes per session: Not on file  . Stress: Not on file  Relationships  . Social connections:    Talks on phone: Not on file    Gets together: Not on file    Attends religious service: Not on file    Active member of club or organization: Not on file    Attends meetings of clubs or organizations: Not on file    Relationship status: Not on file  . Intimate partner violence:    Fear of current or ex partner: Not on file    Emotionally abused: Not on file    Physically abused: Not on file    Forced sexual activity: Not on file  Other Topics Concern  . Not on file  Social History Narrative  . Not on file   Past Surgical History:  Procedure Laterality Date  . ABDOMINAL HYSTERECTOMY    . COLONOSCOPY  03-28-2007    Dr Lemar LivingsByrnett hyperplastic polyp removed  . EYE SURGERY Bilateral    cataracts  . HAMMER TOE SURGERY Left 06/05/2015   Procedure: HAMMER TOE CORRECTION WITH TENDON LENGTHENING;  Surgeon: Recardo EvangelistMatthew Troxler, DPM;  [facility];  Service: Podiatry;  Laterality: Left;  SEDATION WITH LOCAL  . HEMORRHOID SURGERY    . KIDNEY STONE SURGERY    . THYROID SURGERY     Family History  Problem Relation Age of Onset  . Cancer Brother 48       colon   Allergies  Allergen Reactions  . Zetia [Ezetimibe] Other (See Comments)    Muscle pain      Assessment & Plan:  The patient presents to review vascular studies.  The patient was initially seen on February 07, 2018 for evaluation of bilateral lower extremity edema and discomfort. Since our initial visit, the patient has been engaging in conservative therapy including wearing medical grade 1 compression socks and elevating her legs on a daily basis.  The patient notes an improvement in her lower extremity edema and discomfort since initiating conservative therapy.  The patient  denies any claudication-like symptoms, rest pain or ulcer formation to the bilateral lower extremity.  Patient underwent a bilateral ABI which was notable for right: 1.14 and left: 1.06.  Resting bilateral ankle brachial indices were within normal range.  No evidence of bilateral lower extremity arterial disease.  The patient underwent a bilateral lower extremity venous reflux which was notable for no evidence of deep vein thrombosis bilaterally.  No venous reflux seen in the deep or superficial system.  Patient denies any fever, nausea vomiting.  1. Lymphedema - Stable Patient without venous reflux  noted on today's venous duplex Discussed the option of adding a lymphedema pump to the patient's conservative therapy. At this time, the patient is not interested in moving forward with this therapy. The patient should continue wearing her medical grade 1 compression socks, elevating her legs and remaining active on a daily basis If the patient would like to move forward with the lymphedema she is to call the office and at that point I will be happy to apply to her insurance The patient would like to follow-up PRN The patient was instructed to call the office in the interim if any worsening edema or ulcerations to the legs, feet or toes occurs. The patient expresses their understanding  2. Primary osteoarthritis of both knees - Stable Patient with normal ABI on today's duplex. No evidence of bilateral lower extremity arterial disease This could be a contributing factor to the patient's bilateral lower extremity discomfort  Current Outpatient Medications on File Prior to Visit  Medication Sig Dispense Refill  . amLODipine (NORVASC) 5 MG tablet Take 5 mg by mouth daily.     Marland Kitchen aspirin 81 MG tablet Take 81 mg by mouth daily.    . Cholecalciferol (VITAMIN D-3 PO) Take by mouth.    . Cyanocobalamin (VITAMIN B-12 PO) Take by mouth.    . diazepam (VALIUM) 5 MG tablet Take 5 mg by mouth every 6 (six) hours  as needed for anxiety.    . fluticasone (FLONASE) 50 MCG/ACT nasal spray Place 1 spray into both nostrils daily.     . furosemide (LASIX) 20 MG tablet Take 20 mg by mouth.     . metoCLOPramide (REGLAN) 10 MG tablet Take 1 tablet (10 mg total) by mouth 3 (three) times daily with meals. 90 tablet 0  . metoprolol succinate (TOPROL-XL) 25 MG 24 hr tablet Take by mouth.    . Omega-3 Fatty Acids (OMEGA 3 PO) Take by mouth.    . potassium chloride (KLOR-CON) 8 MEQ tablet Take 8 mEq by mouth daily as needed.    . propranolol ER (INDERAL LA) 80 MG 24 hr capsule Take 80 mg by mouth daily.     Marland Kitchen HYDROcodone-acetaminophen (NORCO) 5-325 MG per tablet Take 1 tablet by mouth every 6 (six) hours as needed for moderate pain. (Patient not taking: Reported on 02/07/2018) 30 tablet 0  . ondansetron (ZOFRAN ODT) 4 MG disintegrating tablet Take 1 tablet (4 mg total) by mouth every 8 (eight) hours as needed for nausea or vomiting. (Patient not taking: Reported on 02/07/2018) 20 tablet 0   No current facility-administered medications on file prior to visit.    There are no Patient Instructions on file for this visit. No follow-ups on file.  Arlone Lenhardt A Nashiya Disbrow, PA-C

## 2018-11-03 ENCOUNTER — Ambulatory Visit: Payer: Medicare HMO | Admitting: Family Medicine

## 2019-01-01 ENCOUNTER — Ambulatory Visit (INDEPENDENT_AMBULATORY_CARE_PROVIDER_SITE_OTHER): Payer: Medicare HMO

## 2019-01-01 ENCOUNTER — Encounter (INDEPENDENT_AMBULATORY_CARE_PROVIDER_SITE_OTHER): Payer: Self-pay | Admitting: Orthopaedic Surgery

## 2019-01-01 ENCOUNTER — Ambulatory Visit (INDEPENDENT_AMBULATORY_CARE_PROVIDER_SITE_OTHER): Payer: Medicare HMO | Admitting: Orthopaedic Surgery

## 2019-01-01 ENCOUNTER — Other Ambulatory Visit: Payer: Self-pay

## 2019-01-01 VITALS — BP 143/79 | HR 84 | Resp 16 | Ht 66.0 in | Wt 204.0 lb

## 2019-01-01 DIAGNOSIS — M25512 Pain in left shoulder: Secondary | ICD-10-CM | POA: Diagnosis not present

## 2019-01-01 MED ORDER — LIDOCAINE HCL 2 % IJ SOLN
2.0000 mL | INTRAMUSCULAR | Status: AC | PRN
  Administered 2019-01-01: 2 mL

## 2019-01-01 MED ORDER — METHYLPREDNISOLONE ACETATE 40 MG/ML IJ SUSP
80.0000 mg | INTRAMUSCULAR | Status: AC | PRN
  Administered 2019-01-01: 80 mg via INTRA_ARTICULAR

## 2019-01-01 MED ORDER — BUPIVACAINE HCL 0.5 % IJ SOLN
2.0000 mL | INTRAMUSCULAR | Status: AC | PRN
  Administered 2019-01-01: 2 mL via INTRA_ARTICULAR

## 2019-01-01 NOTE — Patient Instructions (Signed)
Shoulder Range of Motion Exercises  Shoulder range of motion (ROM) exercises are done to keep the shoulder moving freely or to increase movement. They are often recommended for people who have shoulder pain or stiffness or who are recovering from a shoulder surgery.  Phase 1 exercises  When you are able, do this exercise 1-2 times per day for 30-60 seconds in each direction, or as directed by your health care provider.  Pendulum exercise  To do this exercise while sitting:  1. Sit in a chair or at the edge of your bed with your feet flat on the floor.  2. Let your affected arm hang down in front of you over the edge of the bed or chair.  3. Relax your shoulder, arm, and hand.  4. Rock your body so your arm gently swings in small circles. You can also use your unaffected arm to start the motion.  5. Repeat changing the direction of the circles, swinging your arm left and right, and swinging your arm forward and back.  To do this exercise while standing:  1. Stand next to a sturdy chair or table, and hold on to it with your hand on your unaffected side.  2. Bend forward at the waist.  3. Bend your knees slightly.  4. Relax your shoulder, arm, and hand.  5. While keeping your shoulder relaxed, use body motion to swing your arm in small circles.  6. Repeat changing the direction of the circles, swinging your arm left and right, and swinging your arm forward and back.  7. Between exercises, stand up tall and take a short break to relax your lower back.    Phase 2 exercises  Do these exercises 1-2 times per day or as told by your health care provider. Hold each stretch for 30 seconds, and repeat 3 times. Do the exercises with one or both arms as instructed by your health care provider.  For these exercises, sit at a table with your hand and arm supported by the table. A chair that slides easily or has wheels can be helpful.  External rotation  1. Turn your chair so that your affected side is nearest to the  table.  2. Place your forearm on the table to your side. Bend your elbow about 90 at the elbow (right angle) and place your hand palm facing down on the table. Your elbow should be about 6 inches away from your side.  3. Keeping your arm on the table, lean your body forward.  Abduction  1. Turn your chair so that your affected side is nearest to the table.  2. Place your forearm and hand on the table so that your thumb points toward the ceiling and your arm is straight out to your side.  3. Slide your hand out to the side and away from you, using your unaffected arm to do the work.  4. To increase the stretch, you can slide your chair away from the table.  Flexion: forward stretch  1. Sit facing the table. Place your hand and elbow on the table in front of you.  2. Slide your hand forward and away from you, using your unaffected arm to do the work.  3. To increase the stretch, you can slide your chair backward.  Phase 3 exercises  Do these exercises 1-2 times per day or as told by your health care provider. Hold each stretch for 30 seconds, and repeat 3 times. Do the exercises with one or   both arms as instructed by your health care provider.  Cross-body stretch: posterior capsule stretch  1. Lift your arm straight out in front of you.  2. Bend your arm 90 at the elbow (right angle) so your forearm moves across your body.  3. Use your other arm to gently pull the elbow across your body, toward your other shoulder.  Wall climbs  1. Stand with your affected arm extended out to the side with your hand resting on a door frame.  2. Slide your hand slowly up the door frame.  3. To increase the stretch, step through the door frame. Keep your body upright and do not lean.  Wand exercises  You will need a cane, a piece of PVC pipe, or a sturdy wooden dowel for wand exercises.  Flexion  To do this exercise while standing:  1. Hold the wand with both of your hands, palms down.  2. Using the other arm to help, lift your arms  up and over your head, if able.  3. Push upward with your other arm to gently increase the stretch.  To do this exercise while lying down:  1. Lie on your back with your elbows resting on the floor and the wand in both your hands. Your hands will be palm down, or pointing toward your feet.  2. Lift your hands toward the ceiling, using your unaffected arm to help if needed.  3. Bring your arms overhead as able, using your unaffected arm to help if needed.  Internal rotation  1. Stand while holding the wand behind you with both hands. Your unaffected arm should be extended above your head with the arm of the affected side extended behind you at the level of your waist. The wand should be pointing straight up and down as you hold it.  2. Slowly pull the wand up behind your back by straightening the elbow of your unaffected arm and bending the elbow of your affected arm.  External rotation  1. Lie on your back with your affected upper arm supported on a small pillow or rolled towel. When you first do this exercise, keep your upper arm close to your body. Over time, bring your arm up to a 90 angle out to the side.  2. Hold the wand across your stomach and with both hands palm up. Your elbow on your affected side should be bent at a 90 angle.  3. Use your unaffected side to help push your forearm away from you and toward the floor. Keep your elbow on your affected side bent at a 90 angle.  Contact a health care provider if you have:   New or increasing pain.   New numbness, tingling, weakness, or discoloration in your arm or hand.  This information is not intended to replace advice given to you by your health care provider. Make sure you discuss any questions you have with your health care provider.  Document Released: 06/26/2003 Document Revised: 11/09/2017 Document Reviewed: 11/09/2017  Elsevier Interactive Patient Education  2019 [employer]

## 2019-01-01 NOTE — Progress Notes (Signed)
Office Visit Note   Patient: Danielle Ruiz           Date of Birth: March 30, 1934           MRN: [id number] Visit Date: 01/01/2019              Requested by: Patrice Paradise, MD [address] [facility] [place], [place] [address] PCP: Patrice Paradise, MD   Assessment & Plan: Visit Diagnoses:  1. Acute pain of left shoulder     Plan: Left shoulder pain with several diagnostic possibilities including small rotator cuff tear and/or mild  gleno humeral arthritis.  Long discussion regarding treatment options.  We will try a subacromial cortisone injection and apply a sling.  Limited in her ability to take NSAIDs with cardiac comorbidity.  Consider MRI scan over time.  We will also give her shoulder exercises Follow-Up Instructions: Return if symptoms worsen or fail to improve.   Orders:  Orders Placed This Encounter  Procedures  . Large Joint Inj: L subacromial bursa  . XR Shoulder Left   No orders of the defined types were placed in this encounter.     Procedures: Large Joint Inj: L subacromial bursa on 01/01/2019 10:20 AM Indications: pain and diagnostic evaluation Details: 25 G 1.5 in needle, anterolateral approach  Arthrogram: No  Medications: 2 mL lidocaine 2 %; 2 mL bupivacaine 0.5 %; 80 mg methylPREDNISolone acetate 40 MG/ML Consent was given by the patient. Immediately prior to procedure a time out was called to verify the correct patient, procedure, equipment, support staff and site/side marked as required. Patient was prepped and draped in the usual sterile fashion.       Clinical Data: No additional findings.   Subjective: Chief Complaint  Patient presents with  . Left Shoulder - Injury   Ms. Proffer is a 85 year old female who presents with left shoulder pain x 4 weeks. She injured her shoulder; however, she cannot remember how she injured it. The pain runs from her shoulder to her elbow. She has limited range of motion with more pain  at night. She has difficulty lifting her arm and dressing. She is experiencing weakness. She does not have any numbness. She has difficulty sleeping at night. She has not had left shoulder surgery. She is not diabetic.   HPI  Review of Systems  Constitutional: Positive for fatigue.  HENT: Negative for trouble swallowing.   Eyes: Negative for pain.  Respiratory: Negative for shortness of breath.   Cardiovascular: Positive for leg swelling.  Gastrointestinal: Negative for constipation.  Endocrine: Negative for cold intolerance.  Genitourinary: Negative for difficulty urinating.  Musculoskeletal: Negative for neck pain.  Skin: Negative for rash.  Allergic/Immunologic: Negative for immunocompromised state.  Neurological: Positive for weakness and numbness.  Hematological: Does not bruise/bleed easily.  Psychiatric/Behavioral: Positive for sleep disturbance.     Objective: Vital Signs: BP (!) 143/79 (BP Location: Right Arm, Patient Position: Sitting, Cuff Size: Normal)   Pulse 84   Resp 16   Ht 5\' 6"  (1.676 m)   Wt 204 lb (92.5 kg)   BMI 32.93 kg/m   Physical Exam Constitutional:      Appearance: She is well-developed.  Eyes:     Pupils: Pupils are equal, round, and reactive to light.  Pulmonary:     Effort: Pulmonary effort is normal.  Skin:    General: Skin is warm and dry.  Neurological:     Mental Status: She is alert  and oriented to person, place, and time.  Psychiatric:        Behavior: Behavior normal.     Ortho Exam awake alert and oriented x3.  Comfortable sitting.  Mild loss of full overhead motion passively actively can just about get on full flexion.  Abduction 90 degrees.  Minimally positive empty can test.  No loss of strength.  Biceps intact.  Skin intact.  Some discomfort in the anterior subacromial region but no grinding or crepitation.  Minimally positive impingement  Specialty Comments:  No specialty comments available.  Imaging: Xr Shoulder Left   Result Date: 01/01/2019 Films of the left shoulder obtained in several projections.  There is a normal space between the humeral head and the acromium.  Some downsloping of the lateral acromion.  A very small inferior humeral head spur but the joint space is well-maintained.  No ectopic calcification.  No acute changes.  Some mild degenerative change at the acromioclavicular joint    PMFS History: Patient Active Problem List   Diagnosis Date Noted  . Acute pain of left shoulder 01/01/2019  . Lymphedema 05/03/2018  . Lower extremity pain, bilateral 02/07/2018  . Degenerative arthritis of knee, bilateral 05/15/2015  . Internal hemorrhoid 10/02/2013  . Rectal bleeding 10/02/2013   Past Medical History:  Diagnosis Date  . COPD (chronic obstructive pulmonary disease) (HCC)    on chest xray  . Dysrhythmia    PVCs  . GERD (gastroesophageal reflux disease)   . Hypercholesteremia   . Hypertension   . Irregular heart beat   . Kidney stones   . Osteoporosis   . Wears dentures    full upper and lower    Family History  Problem Relation Age of Onset  . Cancer Brother 83       colon    Past Surgical History:  Procedure Laterality Date  . ABDOMINAL HYSTERECTOMY    . COLONOSCOPY  03-28-2007    Dr Lemar Livings hyperplastic polyp removed  . EYE SURGERY Bilateral    cataracts  . HAMMER TOE SURGERY Left 06/05/2015   Procedure: HAMMER TOE CORRECTION WITH TENDON LENGTHENING;  Surgeon: Recardo Evangelist, DPM;  Location: [facility];  Service: Podiatry;  Laterality: Left;  SEDATION WITH LOCAL  . HEMORRHOID SURGERY    . KIDNEY STONE SURGERY    . THYROID SURGERY     Social History   Occupational History  . Not on file  Tobacco Use  . Smoking status: Former Smoker    Packs/day: 1.00    Years: 20.00    Pack years: 20.00    Types: Cigarettes    Last attempt to quit: 10/11/1994    Years since quitting: 24.2  . Smokeless tobacco: Never Used  Substance and Sexual Activity  . Alcohol use:  Yes    Alcohol/week: 7.0 standard drinks    Types: 7 Glasses of wine per week    Comment: wine  . Drug use: No  . Sexual activity: Not on file

## 2019-02-13 DIAGNOSIS — N390 Urinary tract infection, site not specified: Secondary | ICD-10-CM | POA: Insufficient documentation

## 2019-02-13 DIAGNOSIS — R109 Unspecified abdominal pain: Secondary | ICD-10-CM | POA: Insufficient documentation

## 2019-02-20 ENCOUNTER — Other Ambulatory Visit: Payer: Self-pay | Admitting: Urology

## 2019-02-20 DIAGNOSIS — N390 Urinary tract infection, site not specified: Secondary | ICD-10-CM

## 2019-02-22 ENCOUNTER — Other Ambulatory Visit: Payer: Self-pay | Admitting: Urology

## 2019-02-27 ENCOUNTER — Inpatient Hospital Stay: Admission: RE | Admit: 2019-02-27 | Payer: Medicare HMO | Source: Ambulatory Visit

## 2019-03-02 ENCOUNTER — Ambulatory Visit
Admission: RE | Admit: 2019-03-02 | Discharge: 2019-03-02 | Disposition: A | Discharge status: Home / Self Care | Payer: Medicare HMO | Source: Ambulatory Visit | Attending: Urology | Admitting: Urology

## 2019-03-02 DIAGNOSIS — N390 Urinary tract infection, site not specified: Secondary | ICD-10-CM

## 2019-03-02 MED ORDER — IOPAMIDOL (ISOVUE-300) INJECTION 61%
100.0000 mL | Freq: Once | INTRAVENOUS | Status: AC | PRN
  Administered 2019-03-02: 15:00:00 100 mL via INTRAVENOUS

## 2019-03-09 ENCOUNTER — Other Ambulatory Visit: Payer: Self-pay | Admitting: Physician Assistant

## 2019-03-09 DIAGNOSIS — Z1231 Encounter for screening mammogram for malignant neoplasm of breast: Secondary | ICD-10-CM

## 2020-03-19 ENCOUNTER — Other Ambulatory Visit: Payer: Self-pay | Admitting: Physician Assistant

## 2020-03-19 DIAGNOSIS — Z1231 Encounter for screening mammogram for malignant neoplasm of breast: Secondary | ICD-10-CM

## 2020-04-03 ENCOUNTER — Ambulatory Visit
Admission: RE | Admit: 2020-04-03 | Discharge: 2020-04-03 | Disposition: A | Discharge status: Home / Self Care | Payer: Medicare HMO | Source: Ambulatory Visit | Attending: Physician Assistant | Admitting: Physician Assistant

## 2020-04-03 DIAGNOSIS — Z1231 Encounter for screening mammogram for malignant neoplasm of breast: Secondary | ICD-10-CM | POA: Diagnosis not present

## 2021-01-13 ENCOUNTER — Ambulatory Visit (INDEPENDENT_AMBULATORY_CARE_PROVIDER_SITE_OTHER): Payer: Medicare HMO

## 2021-01-13 ENCOUNTER — Encounter: Payer: Self-pay | Admitting: Family Medicine

## 2021-01-13 ENCOUNTER — Ambulatory Visit: Payer: Self-pay

## 2021-01-13 ENCOUNTER — Other Ambulatory Visit: Payer: Self-pay

## 2021-01-13 ENCOUNTER — Ambulatory Visit: Payer: Medicare HMO | Admitting: Family Medicine

## 2021-01-13 VITALS — BP 122/80 | HR 77 | Ht 66.0 in | Wt 204.2 lb

## 2021-01-13 DIAGNOSIS — G8929 Other chronic pain: Secondary | ICD-10-CM

## 2021-01-13 DIAGNOSIS — M25562 Pain in left knee: Secondary | ICD-10-CM

## 2021-01-13 NOTE — Progress Notes (Signed)
I, Christoper Fabian, LAT, ATC, am serving as scribe for Dr. Clementeen Graham.  Subjective:    CC: Left knee pain  HPI: Pt is an 87 y/o female c/o chronic L knee pain that has been flared up for over a year. Pt locates her L knee pain to her L ant knee.  Pt has seen Dr. Katrinka Blazing previously in 2016 for B knee pain.  She is not doing much exercises for this knee.  She has she is not very active.  Additionally she notes diffuse hand pain that she attributes to arthritis.  She has not tried much treatment for this but wants some pointers on things that she can do on her own to make her hands feel better.  L knee swelling: yes L knee mechanical symptoms: yes Aggravates: walking; stairs; standing to sitting Treatments tried: CBD cream; Tylenol; ice; prior gel injections at [facility]  Dx imaging: 05/15/15 Bilat knee XR  Pertinent review of Systems: No fevers or chills  Relevant historical information: Hypertension   Objective:    Vitals:   01/13/21 1549  BP: 122/80  Pulse: 77  SpO2: 96%   General: Well Developed, well nourished, and in no acute distress.   MSK: Hands bilaterally diffuse DDD PIPs MCPs and DIPs.  Left knee mild effusion normal motion with crepitation.  Tender palpation medial joint line. Stable ligaments exam. Intact strength.  Lab and Radiology Results  X-ray images left knee obtained today personally and independently interpreted Severe DJD patellofemoral joint.  Moderate to severe medial compartment DJD.  Moderate lateral DJD.  No fractures visible. Await formal radiology review  Procedure: Real-time Ultrasound Guided Injection of left knee superior lateral patellar space Device: Philips Affiniti 50G Images permanently stored and available for review in PACS Verbal informed consent obtained.  Discussed risks and benefits of procedure. Warned about infection bleeding damage to structures skin hypopigmentation and fat atrophy among others. Patient expresses  understanding and agreement Time-out conducted.   Noted no overlying erythema, induration, or other signs of local infection.   Skin prepped in a sterile fashion.   Local anesthesia: Topical Ethyl chloride.   With sterile technique and under real time ultrasound guidance:  40 mg of Kenalog and 2 mL of Marcaine injected into knee joint. Fluid seen entering the joint capsule.   Completed without difficulty   Pain immediately resolved suggesting accurate placement of the medication.   Advised to call if fevers/chills, erythema, induration, drainage, or persistent bleeding.   Images permanently stored and available for review in the ultrasound unit.  Impression: Technically successful ultrasound guided injection.        Impression and Recommendations:    Assessment and Plan: 87 y.o. female with left knee pain due to DJD.  Discussed options and recommended conservative management strategies including quad strengthening exercises at home or with PT or even aquatic PT.  Additionally recommend Voltaren gel.  Also recommend weight loss as that will be helpful in managing knee pain due to arthritis.  Today we will proceed with steroid injection.  We can proceed with these about every 3 months.  Additionally discussed total knee replacement.  She has pretty significant knee arthritis and ultimately likely will benefit from knee replacement.  She is not ready to consider surgery at this time.  Discussed that if I am not able to get her pain controlled and she will not consider total knee replacement or is not a good surgical candidate we can proceed with other options such as genicular  nerve ablations.  .  Recheck in 1 month.  PDMP not reviewed this encounter. Orders Placed This Encounter  Procedures  . [place] LIMITED JOINT SPACE STRUCTURES LOW LEFT(NO LINKED CHARGES)    Order Specific Question:   Reason for Exam (SYMPTOM  OR DIAGNOSIS REQUIRED)    Answer:   L knee pain    Order Specific Question:    Preferred imaging location?    Answer:   [facility]  . DG Knee AP/LAT W/Sunrise Left    Standing Status:   Future    Number of Occurrences:   1    Standing Expiration Date:   02/12/2021    Order Specific Question:   Reason for Exam (SYMPTOM  OR DIAGNOSIS REQUIRED)    Answer:   L knee pain    Order Specific Question:   Preferred imaging location?    Answer:   [facility]   No orders of the defined types were placed in this encounter.   Discussed warning signs or symptoms. Please see discharge instructions. Patient expresses understanding.   The above documentation has been reviewed and is accurate and complete Clementeen Graham, M.D.

## 2021-01-13 NOTE — Patient Instructions (Addendum)
Thanks for coming in today.  You had a L knee injection.  Call or go to the ER if you develop a large red swollen joint with extreme pain or oozing puss.  Please use voltaren gel up to 4x daily for pain as needed.   For your hands work on heat and hand motion.  Voltaren gel will help.  Modeling clay will help  Recheck in 1 month.   Please get an Xray today before you leave  Quad strength and weight loss will help.

## 2021-01-14 DIAGNOSIS — J449 Chronic obstructive pulmonary disease, unspecified: Secondary | ICD-10-CM | POA: Insufficient documentation

## 2021-01-14 DIAGNOSIS — M810 Age-related osteoporosis without current pathological fracture: Secondary | ICD-10-CM | POA: Insufficient documentation

## 2021-01-14 DIAGNOSIS — K219 Gastro-esophageal reflux disease without esophagitis: Secondary | ICD-10-CM | POA: Insufficient documentation

## 2021-01-14 DIAGNOSIS — N200 Calculus of kidney: Secondary | ICD-10-CM | POA: Insufficient documentation

## 2021-01-14 DIAGNOSIS — E7800 Pure hypercholesterolemia, unspecified: Secondary | ICD-10-CM | POA: Insufficient documentation

## 2021-01-14 DIAGNOSIS — I493 Ventricular premature depolarization: Secondary | ICD-10-CM | POA: Insufficient documentation

## 2021-01-15 NOTE — Progress Notes (Signed)
X-ray left knee shows no fractures.  Medium arthritis is present.

## 2021-02-11 ENCOUNTER — Ambulatory Visit: Payer: Medicare HMO | Admitting: Family Medicine

## 2021-02-12 ENCOUNTER — Ambulatory Visit
Admission: RE | Admit: 2021-02-12 | Discharge: 2021-02-12 | Disposition: A | Discharge status: Home / Self Care | Payer: Medicare HMO | Source: Ambulatory Visit | Attending: Unknown Physician Specialty | Admitting: Unknown Physician Specialty

## 2021-02-12 ENCOUNTER — Other Ambulatory Visit: Payer: Self-pay | Admitting: Unknown Physician Specialty

## 2021-02-12 ENCOUNTER — Ambulatory Visit
Admission: RE | Admit: 2021-02-12 | Discharge: 2021-02-12 | Disposition: A | Discharge status: Home / Self Care | Payer: Medicare HMO | Attending: Unknown Physician Specialty | Admitting: Unknown Physician Specialty

## 2021-02-12 ENCOUNTER — Other Ambulatory Visit: Payer: Self-pay

## 2021-02-12 DIAGNOSIS — J329 Chronic sinusitis, unspecified: Secondary | ICD-10-CM | POA: Insufficient documentation

## 2021-03-31 ENCOUNTER — Other Ambulatory Visit: Payer: Self-pay | Admitting: Urology

## 2021-03-31 DIAGNOSIS — R1011 Right upper quadrant pain: Secondary | ICD-10-CM

## 2021-04-07 ENCOUNTER — Other Ambulatory Visit: Payer: Self-pay | Admitting: Physician Assistant

## 2021-04-07 DIAGNOSIS — Z1231 Encounter for screening mammogram for malignant neoplasm of breast: Secondary | ICD-10-CM

## 2021-04-17 ENCOUNTER — Other Ambulatory Visit: Payer: Self-pay

## 2021-04-17 ENCOUNTER — Ambulatory Visit
Admission: RE | Admit: 2021-04-17 | Discharge: 2021-04-17 | Disposition: A | Discharge status: Home / Self Care | Payer: Medicare HMO | Source: Ambulatory Visit | Attending: Urology | Admitting: Urology

## 2021-04-17 DIAGNOSIS — R1011 Right upper quadrant pain: Secondary | ICD-10-CM | POA: Diagnosis present

## 2021-04-17 MED ORDER — IOHEXOL 300 MG/ML  SOLN
100.0000 mL | Freq: Once | INTRAMUSCULAR | Status: AC | PRN
  Administered 2021-04-17: 100 mL via INTRAVENOUS

## 2021-04-21 ENCOUNTER — Other Ambulatory Visit: Payer: Self-pay

## 2021-04-21 ENCOUNTER — Ambulatory Visit
Admission: RE | Admit: 2021-04-21 | Discharge: 2021-04-21 | Disposition: A | Discharge status: Home / Self Care | Payer: Medicare HMO | Source: Ambulatory Visit | Attending: Physician Assistant | Admitting: Physician Assistant

## 2021-04-21 DIAGNOSIS — Z1231 Encounter for screening mammogram for malignant neoplasm of breast: Secondary | ICD-10-CM | POA: Insufficient documentation

## 2021-05-08 NOTE — Progress Notes (Deleted)
   I, Philbert Riser, LAT, ATC acting as a scribe for Clementeen Graham, MD.  Danielle Ruiz is a 87 y.o. female who presents to [facility] at [facility] today for continued chronic L knee pain. Pt was last seen by Dr. Denyse Amass on 01/13/21 and was given a L knee steroid injection and advised to work on quad strengthening and use Voltaren gel. Today, pt reports   Dx imaging: 01/13/21 L knee XR 05/15/15 Bilat knee XR  Pertinent review of systems: ***  Relevant historical information: ***   Exam:  There were no vitals taken for this visit. General: Well Developed, well nourished, and in no acute distress.   MSK: ***    Lab and Radiology Results No results found for this or any previous visit (from the past 72 hour(s)). No results found.     Assessment and Plan: 87 y.o. female with ***   PDMP not reviewed this encounter. No orders of the defined types were placed in this encounter.  No orders of the defined types were placed in this encounter.    Discussed warning signs or symptoms. Please see discharge instructions. Patient expresses understanding.   ***

## 2021-05-11 ENCOUNTER — Other Ambulatory Visit: Payer: Self-pay

## 2021-05-11 ENCOUNTER — Ambulatory Visit: Payer: Medicare HMO | Admitting: Family Medicine

## 2021-05-11 ENCOUNTER — Ambulatory Visit: Payer: Self-pay

## 2021-05-11 ENCOUNTER — Encounter: Payer: Self-pay | Admitting: Family Medicine

## 2021-05-11 VITALS — BP 118/80 | HR 114 | Ht 66.0 in | Wt 204.0 lb

## 2021-05-11 DIAGNOSIS — M25562 Pain in left knee: Secondary | ICD-10-CM

## 2021-05-11 DIAGNOSIS — G8929 Other chronic pain: Secondary | ICD-10-CM

## 2021-05-11 NOTE — Progress Notes (Signed)
I, Philbert Riser, LAT, ATC acting as a scribe for Danielle Graham, MD.  Danielle Ruiz is a 87 y.o. female who presents to [facility] at [facility] today for continued chronic left knee pain. Pt was last seen by Dr. Denyse Amass on 01/13/21 and was given a L knee steroid injection and was advised to work on weight loss, quad strengthening, and Voltaren gel. Today, pt reports prior steroid injection lasted about 2 month. Pt notes she is going to the beach tomorrow and would like some relief from the knee pain. Pt locates pain to all over knee joint.  Dx imaging: 05/15/15 Bilat knee XR  Pertinent review of systems: No fevers or chills  Relevant historical information: COPD.  Osteoporosis.   Exam:  BP 118/80   Pulse (!) 114   Ht 5\' 6"  (1.676 m)   Wt 204 lb (92.5 kg)   SpO2 95%   BMI 32.93 kg/m  General: Well Developed, well nourished, and in no acute distress.   MSK: Left knee mild effusion.  Normal motion with crepitation. Mild antalgic gait.    Lab and Radiology Results  Procedure: Real-time Ultrasound Guided Injection of left knee superior lateral patellar space Device: Philips Affiniti 50G Images permanently stored and available for review in PACS Verbal informed consent obtained.  Discussed risks and benefits of procedure. Warned about infection bleeding damage to structures skin hypopigmentation and fat atrophy among others. Patient expresses understanding and agreement Time-out conducted.   Noted no overlying erythema, induration, or other signs of local infection.   Skin prepped in a sterile fashion.   Local anesthesia: Topical Ethyl chloride.   With sterile technique and under real time ultrasound guidance: 40 mg of Kenalog and 2 mL of Marcaine injected into knee joint. Fluid seen entering the joint capsule.   Completed without difficulty   Pain immediately resolved suggesting accurate placement of the medication.   Advised to call if fevers/chills, erythema,  induration, drainage, or persistent bleeding.   Images permanently stored and available for review in the ultrasound unit.  Impression: Technically successful ultrasound guided injection.     EXAM: LEFT KNEE 3 VIEWS   COMPARISON:  None.   FINDINGS: No fracture or dislocation of the left knee. There is moderate tricompartmental joint space narrowing and osteophytosis, worst in the patellofemoral compartment. Small, nonspecific knee joint effusion. Diffuse soft tissue edema about the knee.   IMPRESSION: 1. No fracture or dislocation of the left knee. There is moderate tricompartmental joint space narrowing and osteophytosis, worst in the patellofemoral compartment. Small, nonspecific knee joint effusion.   2.  Diffuse soft tissue edema about the knee.     Electronically Signed   By: M.D.   On: 01/15/2021 10:17   I, 03/17/2021, personally (independently) visualized and performed the interpretation of the images attached in this note.     Assessment and Plan: 87 y.o. female with left knee pain due to DJD exacerbation.  Last injection was about 4 months ago.  Plan for repeat injection today.  Recheck back as needed.  Certainly could pursue gel shots in the future as needed as well.  Patient would very much like to avoid knee replacement if possible.   PDMP not reviewed this encounter. Orders Placed This Encounter  Procedures   83 LIMITED JOINT SPACE STRUCTURES LOW LEFT(NO LINKED CHARGES)    Standing Status:   Future    Number of Occurrences:   1    Standing Expiration Date:  11/11/2021    Order Specific Question:   Reason for Exam (SYMPTOM  OR DIAGNOSIS REQUIRED)    Answer:   left knee pain    Order Specific Question:   Preferred imaging location?    Answer:   [facility]   No orders of the defined types were placed in this encounter.    Discussed warning signs or symptoms. Please see discharge instructions. Patient expresses  understanding.   The above documentation has been reviewed and is accurate and complete Danielle Ruiz, M.D.

## 2021-05-11 NOTE — Patient Instructions (Signed)
Thank you for coming in today.   Call or go to the ER if you develop a large red swollen joint with extreme pain or oozing puss.    Return as needed.   Enjoy the beach.    I can do this shot every 3 months if needed. (Nov 1st)

## 2022-04-06 ENCOUNTER — Ambulatory Visit (INDEPENDENT_AMBULATORY_CARE_PROVIDER_SITE_OTHER): Payer: Medicare HMO | Admitting: Family Medicine

## 2022-04-06 ENCOUNTER — Ambulatory Visit: Payer: Self-pay

## 2022-04-06 VITALS — BP 138/98 | HR 68 | Ht 66.0 in | Wt 201.4 lb

## 2022-04-06 DIAGNOSIS — M1712 Unilateral primary osteoarthritis, left knee: Secondary | ICD-10-CM | POA: Diagnosis not present

## 2022-04-06 DIAGNOSIS — M25562 Pain in left knee: Secondary | ICD-10-CM | POA: Diagnosis not present

## 2022-04-06 DIAGNOSIS — G8929 Other chronic pain: Secondary | ICD-10-CM | POA: Diagnosis not present

## 2022-06-03 ENCOUNTER — Other Ambulatory Visit: Payer: Self-pay | Admitting: Physician Assistant

## 2022-06-03 DIAGNOSIS — Z1231 Encounter for screening mammogram for malignant neoplasm of breast: Secondary | ICD-10-CM

## 2022-07-07 ENCOUNTER — Ambulatory Visit: Payer: Medicare HMO | Admitting: Family Medicine

## 2022-07-26 ENCOUNTER — Ambulatory Visit: Payer: Medicare HMO | Admitting: Family Medicine

## 2022-07-28 ENCOUNTER — Ambulatory Visit: Payer: Medicare HMO | Admitting: Family Medicine

## 2022-08-04 ENCOUNTER — Ambulatory Visit: Payer: Medicare HMO | Admitting: Family Medicine

## 2022-09-14 ENCOUNTER — Ambulatory Visit: Payer: Medicare HMO | Admitting: Family Medicine

## 2022-09-20 ENCOUNTER — Ambulatory Visit: Payer: Medicare HMO | Admitting: Family Medicine

## 2022-09-20 NOTE — Progress Notes (Unsigned)
   I, Philbert Riser, LAT, ATC acting as a scribe for Clementeen Graham, MD.  Danielle Ruiz is a 88 y.o. female who presents to [facility] at [facility] today for 3-mo+ f/u L knee pain due to DJD (pt canceled prior f/u visits in Sept, Oct, & Nov). Pt was last seen by Dr. Denyse Amass on 04/06/22 and her L knee was aspirated and injected w/ a steroid. Pt was advised to work on quad strengthening and weight loss. Today, pt reports  Dx imaging: 01/13/21 L knee XR 05/15/15 Bilat knee XR  Pertinent review of systems: ***  Relevant historical information: ***   Exam:  There were no vitals taken for this visit. General: Well Developed, well nourished, and in no acute distress.   MSK: ***    Lab and Radiology Results No results found for this or any previous visit (from the past 72 hour(s)). No results found.     Assessment and Plan: 88 y.o. female with ***   PDMP not reviewed this encounter. No orders of the defined types were placed in this encounter.  No orders of the defined types were placed in this encounter.    Discussed warning signs or symptoms. Please see discharge instructions. Patient expresses understanding.   ***

## 2022-09-21 ENCOUNTER — Ambulatory Visit: Payer: Self-pay

## 2022-09-21 ENCOUNTER — Ambulatory Visit (INDEPENDENT_AMBULATORY_CARE_PROVIDER_SITE_OTHER): Payer: Medicare HMO | Admitting: Family Medicine

## 2022-09-21 VITALS — BP 110/72 | HR 77 | Ht 66.0 in | Wt 205.0 lb

## 2022-09-21 DIAGNOSIS — M25562 Pain in left knee: Secondary | ICD-10-CM

## 2022-09-21 DIAGNOSIS — M1712 Unilateral primary osteoarthritis, left knee: Secondary | ICD-10-CM

## 2022-09-21 DIAGNOSIS — G8929 Other chronic pain: Secondary | ICD-10-CM | POA: Diagnosis not present

## 2022-09-21 NOTE — Patient Instructions (Addendum)
Thank you for coming in today.   You received an injection today. Seek immediate medical attention if the joint becomes red, extremely painful, or is oozing fluid.   Let me know in the new year to authorize Zilretta for the left knee so we can do that when the cortisone shot wears off.

## 2022-09-23 ENCOUNTER — Ambulatory Visit: Payer: Medicare HMO | Admitting: Family Medicine

## 2022-10-01 ENCOUNTER — Emergency Department
Admission: EM | Admit: 2022-10-01 | Discharge: 2022-10-01 | Disposition: A | Discharge status: Home / Self Care | Payer: Medicare HMO | Attending: Emergency Medicine | Admitting: Emergency Medicine

## 2022-10-01 ENCOUNTER — Encounter: Payer: Self-pay | Admitting: Emergency Medicine

## 2022-10-01 ENCOUNTER — Other Ambulatory Visit: Payer: Self-pay

## 2022-10-01 DIAGNOSIS — I10 Essential (primary) hypertension: Secondary | ICD-10-CM | POA: Insufficient documentation

## 2022-10-01 DIAGNOSIS — J449 Chronic obstructive pulmonary disease, unspecified: Secondary | ICD-10-CM | POA: Diagnosis not present

## 2022-10-01 DIAGNOSIS — R112 Nausea with vomiting, unspecified: Secondary | ICD-10-CM

## 2022-10-01 DIAGNOSIS — R000 Tachycardia, unspecified: Secondary | ICD-10-CM | POA: Insufficient documentation

## 2022-10-01 DIAGNOSIS — K529 Noninfective gastroenteritis and colitis, unspecified: Secondary | ICD-10-CM | POA: Insufficient documentation

## 2022-10-01 DIAGNOSIS — R1110 Vomiting, unspecified: Secondary | ICD-10-CM | POA: Diagnosis present

## 2022-10-01 LAB — COMPREHENSIVE METABOLIC PANEL
ALT: 32 U/L (ref 0–44)
AST: 41 U/L (ref 15–41)
Albumin: 3.4 g/dL — ABNORMAL LOW (ref 3.5–5.0)
Alkaline Phosphatase: 73 U/L (ref 38–126)
Anion gap: 11 (ref 5–15)
BUN: 21 mg/dL (ref 8–23)
CO2: 22 mmol/L (ref 22–32)
Calcium: 8.4 mg/dL — ABNORMAL LOW (ref 8.9–10.3)
Chloride: 107 mmol/L (ref 98–111)
Creatinine, Ser: 0.79 mg/dL (ref 0.44–1.00)
GFR, Estimated: >60 mL/min (ref >60)
Glucose, Bld: 221 mg/dL — ABNORMAL HIGH (ref 70–99)
Potassium: 3.3 mmol/L — ABNORMAL LOW (ref 3.5–5.1)
Sodium: 140 mmol/L (ref 135–145)
Total Bilirubin: 1.2 mg/dL (ref 0.3–1.2)
Total Protein: 7.2 g/dL (ref 6.5–8.1)

## 2022-10-01 LAB — CBC WITH DIFFERENTIAL/PLATELET
Abs Immature Granulocytes: 0.1 10*3/uL — ABNORMAL HIGH (ref 0.00–0.07)
Basophils Absolute: 0.1 10*3/uL (ref 0.0–0.1)
Basophils Relative: 0 %
Eosinophils Absolute: 0.2 10*3/uL (ref 0.0–0.5)
Eosinophils Relative: 2 %
HCT: 43.7 % (ref 36.0–46.0)
Hemoglobin: 14.8 g/dL (ref 12.0–15.0)
Immature Granulocytes: 1 %
Lymphocytes Relative: 4 %
Lymphs Abs: 0.5 10*3/uL — ABNORMAL LOW (ref 0.7–4.0)
MCH: 31.4 pg (ref 26.0–34.0)
MCHC: 33.9 g/dL (ref 30.0–36.0)
MCV: 92.6 fL (ref 80.0–100.0)
Monocytes Absolute: 0.7 10*3/uL (ref 0.1–1.0)
Monocytes Relative: 6 %
Neutro Abs: 10.4 10*3/uL — ABNORMAL HIGH (ref 1.7–7.7)
Neutrophils Relative %: 87 %
Platelets: 237 10*3/uL (ref 150–400)
RBC: 4.72 MIL/uL (ref 3.87–5.11)
RDW: 13.1 % (ref 11.5–15.5)
WBC: 12 10*3/uL — ABNORMAL HIGH (ref 4.0–10.5)
nRBC: 0 % (ref 0.0–0.2)

## 2022-10-01 LAB — LIPASE, BLOOD: Lipase: 58 U/L — ABNORMAL HIGH (ref 11–51)

## 2022-10-01 MED ORDER — ONDANSETRON 4 MG PO TBDP: 4.0000 mg | ORAL_TABLET | Freq: Three times a day (TID) | ORAL | 0 refills | Status: AC | PRN

## 2022-10-01 MED ORDER — LOPERAMIDE HCL 2 MG PO CAPS
2.0000 mg | ORAL_CAPSULE | Freq: Once | ORAL | Status: AC
  Administered 2022-10-01: 2 mg via ORAL
  Filled 2022-10-01: qty 1

## 2022-10-01 MED ORDER — ONDANSETRON HCL 4 MG/2ML IJ SOLN
4.0000 mg | Freq: Once | INTRAMUSCULAR | Status: AC
  Administered 2022-10-01: 4 mg via INTRAVENOUS
  Filled 2022-10-01: qty 2

## 2022-10-01 MED ORDER — POTASSIUM CHLORIDE CRYS ER 20 MEQ PO TBCR
40.0000 meq | EXTENDED_RELEASE_TABLET | Freq: Once | ORAL | Status: AC
  Administered 2022-10-01: 40 meq via ORAL
  Filled 2022-10-01: qty 2

## 2022-10-01 MED ORDER — LACTATED RINGERS IV BOLUS
1000.0000 mL | Freq: Once | INTRAVENOUS | Status: AC
  Administered 2022-10-01: 1000 mL via INTRAVENOUS

## 2022-10-01 MED ORDER — ONDANSETRON 4 MG PO TBDP
4.0000 mg | ORAL_TABLET | Freq: Once | ORAL | Status: AC
  Administered 2022-10-01: 4 mg via ORAL
  Filled 2022-10-01: qty 1

## 2022-10-01 NOTE — ED Triage Notes (Addendum)
Pt to triage via w/c with no distress noted; reports N/V/D since last night; denies pain; 25mg  promethazine taken PTA

## 2022-10-01 NOTE — ED Provider Notes (Signed)
[facility] Provider Note    Event Date/Time   First MD Initiated Contact with Patient 10/01/22 [phone]     (approximate)   History   Chief Complaint Emesis   HPI  Danielle Ruiz is a 88 y.o. female with past medical history of hypertension, COPD, and GERD who presents to the ED complaining of vomiting and diarrhea.  Patient reports that around 2:00 this morning she was woken from sleep with nausea and the need to vomit.  She has vomited multiple times since then, describes nonbilious and nonbloody emesis.  This has been associated with watery diarrhea, she has not noticed any blood in her stool.  She denies any associated abdominal pain, does report ongoing nausea despite dose of Phenergan at home and Zofran here in the ED.  She is not aware of any sick contacts, denies any fevers, cough, or congestion.  She also denies any dysuria, hematuria, or flank pain.     Physical Exam   Triage Vital Signs: ED Triage Vitals [10/01/22 0636]  Enc Vitals Group     BP (!) 150/89     Pulse Rate 100     Resp 18     Temp (!) 97.1 F (36.2 C)     Temp Source Oral     SpO2 95 %     Weight 201 lb (91.2 kg)     Height 5\' 6"  (1.676 m)     Head Circumference      Peak Flow      Pain Score 0     Pain Loc      Pain Edu?      Excl. in GC?     Most recent vital signs: Vitals:   10/01/22 0636  BP: (!) 150/89  Pulse: 100  Resp: 18  Temp: (!) 97.1 F (36.2 C)  SpO2: 95%    Constitutional: Alert and oriented. Eyes: Conjunctivae are normal. Head: Atraumatic. Nose: No congestion/rhinnorhea. Mouth/Throat: Mucous membranes are moist.  Cardiovascular: Tachycardic, irregularly irregular rhythm. Grossly normal heart sounds.  2+ radial pulses bilaterally. Respiratory: Normal respiratory effort.  No retractions. Lungs CTAB. Gastrointestinal: Soft and nontender. No distention. Musculoskeletal: No lower extremity tenderness nor edema.  Neurologic:  Normal speech and  language. No gross focal neurologic deficits are appreciated.    ED Results / Procedures / Treatments   Labs (all labs ordered are listed, but only abnormal results are displayed) Labs Reviewed  CBC WITH DIFFERENTIAL/PLATELET - Abnormal; Notable for the following components:      Result Value   WBC 12.0 (*)    Neutro Abs 10.4 (*)    Lymphs Abs 0.5 (*)    Abs Immature Granulocytes 0.10 (*)    All other components within normal limits  COMPREHENSIVE METABOLIC PANEL - Abnormal; Notable for the following components:   Potassium 3.3 (*)    Glucose, Bld 221 (*)    Calcium 8.4 (*)    Albumin 3.4 (*)    All other components within normal limits  LIPASE, BLOOD - Abnormal; Notable for the following components:   Lipase 58 (*)    All other components within normal limits  URINALYSIS, ROUTINE W REFLEX MICROSCOPIC    PROCEDURES:  Critical Care performed: No  Procedures  ED ECG REPORT I, 10/03/22, the attending physician, personally viewed and interpreted this ECG.   Date: 10/01/2022  EKG Time: 8:27  Rate: 113  Rhythm: atrial fibrillation  Axis: Normal  Intervals:none  ST&T Change: Nonspecific T wave  changes   MEDICATIONS ORDERED IN ED: Medications  ondansetron (ZOFRAN-ODT) disintegrating tablet 4 mg (4 mg Oral Given 10/01/22 0642)  ondansetron (ZOFRAN) injection 4 mg (4 mg Intravenous Given 10/01/22 0831)  lactated ringers bolus 1,000 mL (1,000 mLs Intravenous New Bag/Given 10/01/22 0831)  loperamide (IMODIUM) capsule 2 mg (2 mg Oral Given 10/01/22 0831)  potassium chloride SA (KLOR-CON M) CR tablet 40 mEq (40 mEq Oral Given 10/01/22 0831)     IMPRESSION / MDM / ASSESSMENT AND PLAN / ED COURSE  I reviewed the triage vital signs and the nursing notes.                              88 y.o. female with past medical history of hypertension, COPD, and GERD who presents to the ED complaining of nausea, vomiting, and diarrhea since waking up this morning around 2  AM.  Patient's presentation is most consistent with acute presentation with potential threat to life or bodily function.  Differential diagnosis includes, but is not limited to, gastroenteritis, GI bleed, dehydration, electrolyte abnormality, UTI.  Patient well-appearing and in no acute distress, vital signs are unremarkable.  She has a benign abdominal exam and symptoms seem consistent with a gastroenteritis.  Labs are reassuring and remarkable only for mild hypokalemia and hyperglycemia, LFTs and lipase are unremarkable.  She has a mild leukocytosis but suspect viral illness at this time, no urinary symptoms to suggest cystitis.  We will hydrate with IV fluids and treat symptomatically with IV Zofran along with loperamide.  EKG notes atrial fibrillation with mildly increased heart rate, patient reports being in A-fib chronically.  Increased heart rate likely due to some mild dehydration along with patient being late for her morning dose of rate control medication.  We will hydrate with IV fluids and patient to take her usual home dose.  Patient reports feeling better following IV fluids, Zofran, and loperamide.  She is appropriate for discharge home with PCP follow-up, will be prescribed Zofran to take as needed and was counseled to also take loperamide over-the-counter.  She was counseled to return to the ED for new or worsening symptoms, patient agrees with plan.      FINAL CLINICAL IMPRESSION(S) / ED DIAGNOSES   Final diagnoses:  Gastroenteritis  Nausea vomiting and diarrhea     Rx / DC Orders   ED Discharge Orders          Ordered    ondansetron (ZOFRAN-ODT) 4 MG disintegrating tablet  Every 8 hours PRN        10/01/22 [id number]             Note:  This document was prepared using Dragon voice recognition software and may include unintentional dictation errors.   Blake Divine, MD 10/01/22 [id number]

## 2022-10-01 NOTE — ED Notes (Signed)
Patients states nausea with watery stools started this morning. When ask about pain and location patients rubs upper gastric area.Can not describe pain just states sick on stomach. States has a hiatal hernia. Ate egg muffine, nuts, a sandwich yesterday.

## 2022-10-20 DIAGNOSIS — E7800 Pure hypercholesterolemia, unspecified: Secondary | ICD-10-CM | POA: Diagnosis not present

## 2022-10-20 DIAGNOSIS — G629 Polyneuropathy, unspecified: Secondary | ICD-10-CM | POA: Diagnosis not present

## 2022-10-20 DIAGNOSIS — F411 Generalized anxiety disorder: Secondary | ICD-10-CM | POA: Diagnosis not present

## 2022-10-20 DIAGNOSIS — R7303 Prediabetes: Secondary | ICD-10-CM | POA: Diagnosis not present

## 2022-10-20 DIAGNOSIS — M810 Age-related osteoporosis without current pathological fracture: Secondary | ICD-10-CM | POA: Diagnosis not present

## 2022-10-20 DIAGNOSIS — G4733 Obstructive sleep apnea (adult) (pediatric): Secondary | ICD-10-CM | POA: Diagnosis not present

## 2022-10-20 DIAGNOSIS — I4891 Unspecified atrial fibrillation: Secondary | ICD-10-CM | POA: Diagnosis not present

## 2022-10-20 DIAGNOSIS — I10 Essential (primary) hypertension: Secondary | ICD-10-CM | POA: Diagnosis not present

## 2022-11-01 DIAGNOSIS — E538 Deficiency of other specified B group vitamins: Secondary | ICD-10-CM | POA: Diagnosis not present

## 2022-11-16 DIAGNOSIS — K219 Gastro-esophageal reflux disease without esophagitis: Secondary | ICD-10-CM | POA: Diagnosis not present

## 2022-11-16 DIAGNOSIS — R110 Nausea: Secondary | ICD-10-CM | POA: Diagnosis not present

## 2022-11-16 DIAGNOSIS — M170 Bilateral primary osteoarthritis of knee: Secondary | ICD-10-CM | POA: Diagnosis not present

## 2022-11-16 DIAGNOSIS — I10 Essential (primary) hypertension: Secondary | ICD-10-CM | POA: Diagnosis not present

## 2022-11-16 DIAGNOSIS — K449 Diaphragmatic hernia without obstruction or gangrene: Secondary | ICD-10-CM | POA: Diagnosis not present

## 2022-12-02 DIAGNOSIS — E538 Deficiency of other specified B group vitamins: Secondary | ICD-10-CM | POA: Diagnosis not present

## 2022-12-17 DIAGNOSIS — R300 Dysuria: Secondary | ICD-10-CM | POA: Diagnosis not present

## 2022-12-17 DIAGNOSIS — B9689 Other specified bacterial agents as the cause of diseases classified elsewhere: Secondary | ICD-10-CM | POA: Diagnosis not present

## 2022-12-17 DIAGNOSIS — J019 Acute sinusitis, unspecified: Secondary | ICD-10-CM | POA: Diagnosis not present

## 2022-12-17 DIAGNOSIS — N390 Urinary tract infection, site not specified: Secondary | ICD-10-CM | POA: Diagnosis not present

## 2022-12-17 DIAGNOSIS — Z03818 Encounter for observation for suspected exposure to other biological agents ruled out: Secondary | ICD-10-CM | POA: Diagnosis not present

## 2022-12-17 DIAGNOSIS — Z7901 Long term (current) use of anticoagulants: Secondary | ICD-10-CM | POA: Diagnosis not present

## 2023-01-03 DIAGNOSIS — E538 Deficiency of other specified B group vitamins: Secondary | ICD-10-CM | POA: Diagnosis not present

## 2023-02-03 DIAGNOSIS — E538 Deficiency of other specified B group vitamins: Secondary | ICD-10-CM | POA: Diagnosis not present

## 2023-02-14 DIAGNOSIS — R7303 Prediabetes: Secondary | ICD-10-CM | POA: Diagnosis not present

## 2023-02-14 DIAGNOSIS — I4891 Unspecified atrial fibrillation: Secondary | ICD-10-CM | POA: Diagnosis not present

## 2023-02-14 DIAGNOSIS — I10 Essential (primary) hypertension: Secondary | ICD-10-CM | POA: Diagnosis not present

## 2023-02-14 DIAGNOSIS — E7800 Pure hypercholesterolemia, unspecified: Secondary | ICD-10-CM | POA: Diagnosis not present

## 2023-02-23 DIAGNOSIS — I10 Essential (primary) hypertension: Secondary | ICD-10-CM | POA: Diagnosis not present

## 2023-02-23 DIAGNOSIS — I4891 Unspecified atrial fibrillation: Secondary | ICD-10-CM | POA: Diagnosis not present

## 2023-02-23 DIAGNOSIS — I493 Ventricular premature depolarization: Secondary | ICD-10-CM | POA: Diagnosis not present

## 2023-02-23 DIAGNOSIS — J410 Simple chronic bronchitis: Secondary | ICD-10-CM | POA: Diagnosis not present

## 2023-02-23 DIAGNOSIS — E7800 Pure hypercholesterolemia, unspecified: Secondary | ICD-10-CM | POA: Diagnosis not present

## 2023-02-23 DIAGNOSIS — I890 Lymphedema, not elsewhere classified: Secondary | ICD-10-CM | POA: Diagnosis not present

## 2023-02-28 DIAGNOSIS — E7800 Pure hypercholesterolemia, unspecified: Secondary | ICD-10-CM | POA: Diagnosis not present

## 2023-02-28 DIAGNOSIS — I4891 Unspecified atrial fibrillation: Secondary | ICD-10-CM | POA: Diagnosis not present

## 2023-02-28 DIAGNOSIS — Z0000 Encounter for general adult medical examination without abnormal findings: Secondary | ICD-10-CM | POA: Diagnosis not present

## 2023-02-28 DIAGNOSIS — R7303 Prediabetes: Secondary | ICD-10-CM | POA: Diagnosis not present

## 2023-02-28 DIAGNOSIS — I10 Essential (primary) hypertension: Secondary | ICD-10-CM | POA: Diagnosis not present

## 2023-03-08 DIAGNOSIS — E538 Deficiency of other specified B group vitamins: Secondary | ICD-10-CM | POA: Diagnosis not present

## 2023-04-08 DIAGNOSIS — E538 Deficiency of other specified B group vitamins: Secondary | ICD-10-CM | POA: Diagnosis not present

## 2023-05-05 DIAGNOSIS — J019 Acute sinusitis, unspecified: Secondary | ICD-10-CM | POA: Diagnosis not present

## 2023-05-05 DIAGNOSIS — R350 Frequency of micturition: Secondary | ICD-10-CM | POA: Diagnosis not present

## 2023-05-05 DIAGNOSIS — N390 Urinary tract infection, site not specified: Secondary | ICD-10-CM | POA: Diagnosis not present

## 2023-05-09 DIAGNOSIS — E538 Deficiency of other specified B group vitamins: Secondary | ICD-10-CM | POA: Diagnosis not present

## 2023-05-18 DIAGNOSIS — I493 Ventricular premature depolarization: Secondary | ICD-10-CM | POA: Diagnosis not present

## 2023-05-18 DIAGNOSIS — I4891 Unspecified atrial fibrillation: Secondary | ICD-10-CM | POA: Diagnosis not present

## 2023-05-18 DIAGNOSIS — E7800 Pure hypercholesterolemia, unspecified: Secondary | ICD-10-CM | POA: Diagnosis not present

## 2023-05-18 DIAGNOSIS — J410 Simple chronic bronchitis: Secondary | ICD-10-CM | POA: Diagnosis not present

## 2023-05-18 DIAGNOSIS — I10 Essential (primary) hypertension: Secondary | ICD-10-CM | POA: Diagnosis not present

## 2023-05-18 DIAGNOSIS — Z8673 Personal history of transient ischemic attack (TIA), and cerebral infarction without residual deficits: Secondary | ICD-10-CM | POA: Diagnosis not present

## 2023-05-18 DIAGNOSIS — I890 Lymphedema, not elsewhere classified: Secondary | ICD-10-CM | POA: Diagnosis not present

## 2023-06-09 DIAGNOSIS — E538 Deficiency of other specified B group vitamins: Secondary | ICD-10-CM | POA: Diagnosis not present

## 2023-06-20 DIAGNOSIS — R7303 Prediabetes: Secondary | ICD-10-CM | POA: Diagnosis not present

## 2023-06-20 DIAGNOSIS — I10 Essential (primary) hypertension: Secondary | ICD-10-CM | POA: Diagnosis not present

## 2023-06-20 DIAGNOSIS — E7800 Pure hypercholesterolemia, unspecified: Secondary | ICD-10-CM | POA: Diagnosis not present

## 2023-06-27 ENCOUNTER — Other Ambulatory Visit: Payer: Self-pay | Admitting: Physician Assistant

## 2023-06-27 DIAGNOSIS — R7303 Prediabetes: Secondary | ICD-10-CM | POA: Diagnosis not present

## 2023-06-27 DIAGNOSIS — I4891 Unspecified atrial fibrillation: Secondary | ICD-10-CM | POA: Diagnosis not present

## 2023-06-27 DIAGNOSIS — Z0000 Encounter for general adult medical examination without abnormal findings: Secondary | ICD-10-CM | POA: Diagnosis not present

## 2023-06-27 DIAGNOSIS — E7800 Pure hypercholesterolemia, unspecified: Secondary | ICD-10-CM | POA: Diagnosis not present

## 2023-06-27 DIAGNOSIS — I10 Essential (primary) hypertension: Secondary | ICD-10-CM | POA: Diagnosis not present

## 2023-06-27 DIAGNOSIS — F411 Generalized anxiety disorder: Secondary | ICD-10-CM | POA: Diagnosis not present

## 2023-06-27 DIAGNOSIS — G4733 Obstructive sleep apnea (adult) (pediatric): Secondary | ICD-10-CM | POA: Diagnosis not present

## 2023-06-27 DIAGNOSIS — Z1231 Encounter for screening mammogram for malignant neoplasm of breast: Secondary | ICD-10-CM | POA: Diagnosis not present

## 2023-06-27 DIAGNOSIS — G629 Polyneuropathy, unspecified: Secondary | ICD-10-CM | POA: Diagnosis not present

## 2023-06-28 ENCOUNTER — Other Ambulatory Visit: Payer: Self-pay | Admitting: Physician Assistant

## 2023-06-28 DIAGNOSIS — G459 Transient cerebral ischemic attack, unspecified: Secondary | ICD-10-CM

## 2023-06-28 DIAGNOSIS — G629 Polyneuropathy, unspecified: Secondary | ICD-10-CM

## 2023-07-04 ENCOUNTER — Ambulatory Visit
Admission: RE | Admit: 2023-07-04 | Discharge: 2023-07-04 | Disposition: A | Discharge status: Home / Self Care | Payer: Medicare HMO | Source: Ambulatory Visit | Attending: Physician Assistant | Admitting: Physician Assistant

## 2023-07-04 DIAGNOSIS — I639 Cerebral infarction, unspecified: Secondary | ICD-10-CM | POA: Diagnosis not present

## 2023-07-04 DIAGNOSIS — G629 Polyneuropathy, unspecified: Secondary | ICD-10-CM | POA: Insufficient documentation

## 2023-07-04 DIAGNOSIS — G459 Transient cerebral ischemic attack, unspecified: Secondary | ICD-10-CM | POA: Insufficient documentation

## 2023-07-04 DIAGNOSIS — I10 Essential (primary) hypertension: Secondary | ICD-10-CM | POA: Diagnosis not present

## 2023-07-04 DIAGNOSIS — I6782 Cerebral ischemia: Secondary | ICD-10-CM | POA: Diagnosis not present

## 2023-07-04 MED ORDER — GADOBUTROL 1 MMOL/ML IV SOLN
9.0000 mL | Freq: Once | INTRAVENOUS | Status: AC | PRN
  Administered 2023-07-04: 9 mL via INTRAVENOUS

## 2023-07-11 DIAGNOSIS — E538 Deficiency of other specified B group vitamins: Secondary | ICD-10-CM | POA: Diagnosis not present

## 2023-07-19 DIAGNOSIS — I6523 Occlusion and stenosis of bilateral carotid arteries: Secondary | ICD-10-CM | POA: Diagnosis not present

## 2023-07-19 DIAGNOSIS — G629 Polyneuropathy, unspecified: Secondary | ICD-10-CM | POA: Diagnosis not present

## 2023-07-19 DIAGNOSIS — G459 Transient cerebral ischemic attack, unspecified: Secondary | ICD-10-CM | POA: Diagnosis not present

## 2023-08-02 DIAGNOSIS — G5793 Unspecified mononeuropathy of bilateral lower limbs: Secondary | ICD-10-CM | POA: Diagnosis not present

## 2023-08-02 DIAGNOSIS — R2990 Unspecified symptoms and signs involving the nervous system: Secondary | ICD-10-CM | POA: Diagnosis not present

## 2023-08-02 DIAGNOSIS — R350 Frequency of micturition: Secondary | ICD-10-CM | POA: Diagnosis not present

## 2023-08-02 DIAGNOSIS — R1032 Left lower quadrant pain: Secondary | ICD-10-CM | POA: Diagnosis not present

## 2023-08-02 DIAGNOSIS — I6523 Occlusion and stenosis of bilateral carotid arteries: Secondary | ICD-10-CM | POA: Diagnosis not present

## 2023-08-02 DIAGNOSIS — R1031 Right lower quadrant pain: Secondary | ICD-10-CM | POA: Diagnosis not present

## 2023-08-12 DIAGNOSIS — I493 Ventricular premature depolarization: Secondary | ICD-10-CM | POA: Diagnosis not present

## 2023-08-12 DIAGNOSIS — I10 Essential (primary) hypertension: Secondary | ICD-10-CM | POA: Diagnosis not present

## 2023-08-12 DIAGNOSIS — E7800 Pure hypercholesterolemia, unspecified: Secondary | ICD-10-CM | POA: Diagnosis not present

## 2023-08-12 DIAGNOSIS — I890 Lymphedema, not elsewhere classified: Secondary | ICD-10-CM | POA: Diagnosis not present

## 2023-08-12 DIAGNOSIS — E538 Deficiency of other specified B group vitamins: Secondary | ICD-10-CM | POA: Diagnosis not present

## 2023-08-12 DIAGNOSIS — J410 Simple chronic bronchitis: Secondary | ICD-10-CM | POA: Diagnosis not present

## 2023-08-12 DIAGNOSIS — I4891 Unspecified atrial fibrillation: Secondary | ICD-10-CM | POA: Diagnosis not present

## 2023-08-29 ENCOUNTER — Telehealth: Payer: Self-pay | Admitting: Family Medicine

## 2023-08-29 ENCOUNTER — Telehealth: Payer: Self-pay

## 2023-08-29 NOTE — Telephone Encounter (Signed)
Ran Monovisc for left knee. Just waiting reply from the insurance

## 2023-08-29 NOTE — Telephone Encounter (Signed)
I was able to get the Monovisc approved for patients Left knee for Dr. Denyse Amass can you call and schedule them for where its best. Thank you

## 2023-08-29 NOTE — Telephone Encounter (Signed)
Patient called asking to schedule an appointment with Dr Denyse Amass for a left knee injection. She said that Dr Denyse Amass mentioned for her to contact us to authorize a "special shot" for next time. Looks like he wanted to to do [place].  Can this be authorized for her and then I will contact her to schedule.

## 2023-08-30 NOTE — Telephone Encounter (Signed)
Holding for supply to come in and then I will contact the patient to schedule.

## 2023-08-30 NOTE — Telephone Encounter (Signed)
Monovisc approved for left knee.

## 2023-09-01 DIAGNOSIS — M6281 Muscle weakness (generalized): Secondary | ICD-10-CM | POA: Diagnosis not present

## 2023-09-01 DIAGNOSIS — M79642 Pain in left hand: Secondary | ICD-10-CM | POA: Diagnosis not present

## 2023-09-01 DIAGNOSIS — R2681 Unsteadiness on feet: Secondary | ICD-10-CM | POA: Diagnosis not present

## 2023-09-02 DIAGNOSIS — J019 Acute sinusitis, unspecified: Secondary | ICD-10-CM | POA: Diagnosis not present

## 2023-09-02 DIAGNOSIS — J309 Allergic rhinitis, unspecified: Secondary | ICD-10-CM | POA: Diagnosis not present

## 2023-09-02 DIAGNOSIS — R8290 Unspecified abnormal findings in urine: Secondary | ICD-10-CM | POA: Diagnosis not present

## 2023-09-05 DIAGNOSIS — R2681 Unsteadiness on feet: Secondary | ICD-10-CM | POA: Diagnosis not present

## 2023-09-05 DIAGNOSIS — M6281 Muscle weakness (generalized): Secondary | ICD-10-CM | POA: Diagnosis not present

## 2023-09-05 DIAGNOSIS — M79642 Pain in left hand: Secondary | ICD-10-CM | POA: Diagnosis not present

## 2023-09-05 NOTE — Telephone Encounter (Signed)
Scheduled

## 2023-09-06 NOTE — Telephone Encounter (Signed)
Monovisc authorized for left knee Deductible does not apply Once the OOP has been met patient is covered at 100% Only one copay per DOS Copay is $10 No PA required  Document scanned

## 2023-09-07 ENCOUNTER — Ambulatory Visit: Payer: Medicare HMO | Admitting: Family Medicine

## 2023-09-09 DIAGNOSIS — M6281 Muscle weakness (generalized): Secondary | ICD-10-CM | POA: Diagnosis not present

## 2023-09-09 DIAGNOSIS — R2681 Unsteadiness on feet: Secondary | ICD-10-CM | POA: Diagnosis not present

## 2023-09-09 DIAGNOSIS — M79642 Pain in left hand: Secondary | ICD-10-CM | POA: Diagnosis not present

## 2023-09-12 DIAGNOSIS — E538 Deficiency of other specified B group vitamins: Secondary | ICD-10-CM | POA: Diagnosis not present

## 2023-09-16 DIAGNOSIS — M6281 Muscle weakness (generalized): Secondary | ICD-10-CM | POA: Diagnosis not present

## 2023-09-16 DIAGNOSIS — M79642 Pain in left hand: Secondary | ICD-10-CM | POA: Diagnosis not present

## 2023-09-16 DIAGNOSIS — R2681 Unsteadiness on feet: Secondary | ICD-10-CM | POA: Diagnosis not present

## 2023-09-19 ENCOUNTER — Ambulatory Visit: Payer: Medicare HMO | Admitting: Family Medicine

## 2023-09-19 NOTE — Progress Notes (Unsigned)
   Rubin Payor, PhD, LAT, ATC acting as a scribe for Clementeen Graham, MD.  Danielle Ruiz is a 89 y.o. female who presents to [facility] at [facility] today for cont'd L knee pain. Pt was last seen by Dr. Denyse Amass on 09/21/22 and was given a L knee steroid injection.  Today, pt reports ***  Dx imaging: 01/13/21 L knee XR 05/15/15 Bilat knee XR  Pertinent review of systems: ***  Relevant historical information: ***   Exam:  There were no vitals taken for this visit. General: Well Developed, well nourished, and in no acute distress.   MSK: ***    Lab and Radiology Results No results found for this or any previous visit (from the past 72 hour(s)). No results found.     Assessment and Plan: 89 y.o. female with ***   PDMP not reviewed this encounter. No orders of the defined types were placed in this encounter.  No orders of the defined types were placed in this encounter.    Discussed warning signs or symptoms. Please see discharge instructions. Patient expresses understanding.   ***

## 2023-09-19 NOTE — Progress Notes (Unsigned)
   Rubin Payor, PhD, LAT, ATC acting as a scribe for Clementeen Graham, MD.  Danielle Ruiz is a 89 y.o. female who presents to [facility] at [facility] today for cont'd L knee pain. Pt was last seen by Dr. Denyse Amass on 09/21/22 and was given a L knee steroid injection.  Today, pt reports ***  Dx imaging: 01/13/21 L knee XR 05/15/15 Bilat knee XR  Pertinent review of systems: ***  Relevant historical information: ***   Exam:  There were no vitals taken for this visit. General: Well Developed, well nourished, and in no acute distress.   MSK: ***    Lab and Radiology Results No results found for this or any previous visit (from the past 72 hour(s)). No results found.     Assessment and Plan: 89 y.o. female with ***   PDMP not reviewed this encounter. No orders of the defined types were placed in this encounter.  No orders of the defined types were placed in this encounter.    Discussed warning signs or symptoms. Please see discharge instructions. Patient expresses understanding.   ***

## 2023-09-20 ENCOUNTER — Other Ambulatory Visit: Payer: Self-pay

## 2023-09-20 ENCOUNTER — Ambulatory Visit: Payer: Medicare HMO | Admitting: Family Medicine

## 2023-09-20 ENCOUNTER — Encounter: Payer: Self-pay | Admitting: Family Medicine

## 2023-09-20 VITALS — BP 134/82 | HR 72 | Ht 66.0 in | Wt 201.0 lb

## 2023-09-20 DIAGNOSIS — M1712 Unilateral primary osteoarthritis, left knee: Secondary | ICD-10-CM

## 2023-09-20 DIAGNOSIS — M25562 Pain in left knee: Secondary | ICD-10-CM | POA: Diagnosis not present

## 2023-09-20 DIAGNOSIS — G8929 Other chronic pain: Secondary | ICD-10-CM | POA: Diagnosis not present

## 2023-09-20 DIAGNOSIS — J410 Simple chronic bronchitis: Secondary | ICD-10-CM | POA: Diagnosis not present

## 2023-09-20 MED ORDER — HYALURONAN 88 MG/4ML IX SOSY
88.0000 mg | PREFILLED_SYRINGE | Freq: Once | INTRA_ARTICULAR | Status: AC
  Administered 2023-09-20: 88 mg via INTRA_ARTICULAR

## 2023-09-20 NOTE — Patient Instructions (Addendum)
Thank you for coming in today.   You received an injection today. Seek immediate medical attention if the joint becomes red, extremely painful, or is oozing fluid.   Check back as needed 

## 2023-09-21 NOTE — Telephone Encounter (Signed)
Pt received Monovisc for LEFT knee OA on 09/20/23. Can consider repeat inj on or after 03/21/24.

## 2023-09-28 NOTE — Progress Notes (Unsigned)
   Danielle Payor, PhD, LAT, ATC acting as a scribe for Danielle Graham, MD.  Danielle Ruiz is a 89 y.o. female who presents to [facility] today for cont'd L knee pain. Pt was last seen by Dr. Denyse Ruiz on 09/20/23 and was given a L knee Monovisc injection.  Today, pt reports not any relief from prior Monovisc injection. Prior steroid injection also only provided a wk or 2 of relief.   Dx imaging: 01/13/21 L knee XR 05/15/15 Bilat knee XR  Pertinent review of systems: No fevers or chills  Relevant historical information: COPD   Exam:  BP 134/82   Pulse 74   Ht 5\' 6"  (1.676 m)   Wt 219 lb (99.3 kg)   SpO2 98%   BMI 35.35 kg/m  General: Well Developed, well nourished, and in no acute distress.   MSK: Left knee mild usual normal motion.    Lab and Radiology Results  Procedure: Real-time Ultrasound Guided Injection of left knee joint superior lateral patella space Device: Philips Affiniti 50G/GE Logiq Images permanently stored and available for review in PACS Verbal informed consent obtained.  Discussed risks and benefits of procedure. Warned about infection, bleeding, hyperglycemia damage to structures among others. Patient expresses understanding and agreement Time-out conducted.   Noted no overlying erythema, induration, or other signs of local infection.   Skin prepped in a sterile fashion.   Local anesthesia: Topical Ethyl chloride.   With sterile technique and under real time ultrasound guidance: 40 mg of Kenalog and 2 mL of Marcaine injected into knee joint. Fluid seen entering the joint capsule.   Completed without difficulty   Pain immediately resolved suggesting accurate placement of the medication.   Advised to call if fevers/chills, erythema, induration, drainage, or persistent bleeding.   Images permanently stored and available for review in the ultrasound unit.  Impression: Technically successful ultrasound guided  injection.           Assessment and Plan: 89 y.o. female with left knee pain.  Unfortunately she did not have much improvement after a Monovisc injection 9 days ago.  Plan for steroid injection today.  If this does not work would recommend genicular artery embolization referral.  Patient will contact me if needed.   PDMP not reviewed this encounter. Orders Placed This Encounter  Procedures   [place] LIMITED JOINT SPACE STRUCTURES LOW LEFT(NO LINKED CHARGES)    Reason for Exam (SYMPTOM  OR DIAGNOSIS REQUIRED):   bilat knee pain    Preferred imaging location?:   [facility]   No orders of the defined types were placed in this encounter.    Discussed warning signs or symptoms. Please see discharge instructions. Patient expresses understanding.   The above documentation has been reviewed and is accurate and complete Danielle Ruiz, M.D.

## 2023-09-29 ENCOUNTER — Ambulatory Visit: Payer: Medicare HMO | Admitting: Family Medicine

## 2023-09-29 ENCOUNTER — Other Ambulatory Visit: Payer: Self-pay

## 2023-09-29 VITALS — BP 134/82 | HR 74 | Ht 66.0 in | Wt 219.0 lb

## 2023-09-29 DIAGNOSIS — M25562 Pain in left knee: Secondary | ICD-10-CM

## 2023-09-29 DIAGNOSIS — M1712 Unilateral primary osteoarthritis, left knee: Secondary | ICD-10-CM | POA: Diagnosis not present

## 2023-09-29 DIAGNOSIS — G8929 Other chronic pain: Secondary | ICD-10-CM

## 2023-09-29 NOTE — Patient Instructions (Addendum)
Thank you for coming in today.   You received an injection today. Seek immediate medical attention if the joint becomes red, extremely painful, or is oozing fluid.   Let me know if you would like a referral for the genicular artery embolization procedure  Check back as needed

## 2023-10-10 ENCOUNTER — Ambulatory Visit
Admission: RE | Admit: 2023-10-10 | Discharge: 2023-10-10 | Disposition: A | Discharge status: Home / Self Care | Payer: Medicare HMO | Source: Ambulatory Visit | Attending: Physician Assistant | Admitting: Physician Assistant

## 2023-10-10 DIAGNOSIS — Z1231 Encounter for screening mammogram for malignant neoplasm of breast: Secondary | ICD-10-CM | POA: Diagnosis not present

## 2023-10-13 DIAGNOSIS — E538 Deficiency of other specified B group vitamins: Secondary | ICD-10-CM | POA: Diagnosis not present

## 2023-10-20 DIAGNOSIS — G629 Polyneuropathy, unspecified: Secondary | ICD-10-CM | POA: Diagnosis not present

## 2023-10-20 DIAGNOSIS — G4709 Other insomnia: Secondary | ICD-10-CM | POA: Diagnosis not present

## 2023-10-20 DIAGNOSIS — R600 Localized edema: Secondary | ICD-10-CM | POA: Diagnosis not present

## 2023-11-14 DIAGNOSIS — E538 Deficiency of other specified B group vitamins: Secondary | ICD-10-CM | POA: Diagnosis not present

## 2023-11-24 DIAGNOSIS — K219 Gastro-esophageal reflux disease without esophagitis: Secondary | ICD-10-CM | POA: Diagnosis not present

## 2023-11-24 DIAGNOSIS — R110 Nausea: Secondary | ICD-10-CM | POA: Diagnosis not present

## 2023-11-24 DIAGNOSIS — K449 Diaphragmatic hernia without obstruction or gangrene: Secondary | ICD-10-CM | POA: Diagnosis not present

## 2023-12-15 DIAGNOSIS — I493 Ventricular premature depolarization: Secondary | ICD-10-CM | POA: Diagnosis not present

## 2023-12-15 DIAGNOSIS — I10 Essential (primary) hypertension: Secondary | ICD-10-CM | POA: Diagnosis not present

## 2023-12-15 DIAGNOSIS — I4891 Unspecified atrial fibrillation: Secondary | ICD-10-CM | POA: Diagnosis not present

## 2023-12-15 DIAGNOSIS — J410 Simple chronic bronchitis: Secondary | ICD-10-CM | POA: Diagnosis not present

## 2023-12-15 DIAGNOSIS — E7800 Pure hypercholesterolemia, unspecified: Secondary | ICD-10-CM | POA: Diagnosis not present

## 2023-12-15 DIAGNOSIS — I890 Lymphedema, not elsewhere classified: Secondary | ICD-10-CM | POA: Diagnosis not present

## 2023-12-15 DIAGNOSIS — E538 Deficiency of other specified B group vitamins: Secondary | ICD-10-CM | POA: Diagnosis not present

## 2023-12-20 DIAGNOSIS — I10 Essential (primary) hypertension: Secondary | ICD-10-CM | POA: Diagnosis not present

## 2023-12-20 DIAGNOSIS — R7303 Prediabetes: Secondary | ICD-10-CM | POA: Diagnosis not present

## 2023-12-20 DIAGNOSIS — E7800 Pure hypercholesterolemia, unspecified: Secondary | ICD-10-CM | POA: Diagnosis not present

## 2023-12-22 ENCOUNTER — Ambulatory Visit: Admitting: Podiatry

## 2023-12-22 DIAGNOSIS — L600 Ingrowing nail: Secondary | ICD-10-CM | POA: Diagnosis not present

## 2023-12-22 NOTE — Progress Notes (Signed)
 Subjective:  Patient ID: Danielle Ruiz, female    DOB: 04-05-1934,  MRN: [id number]  Chief Complaint  Patient presents with   Nail Problem    Nail trim     89 y.o. female presents with the above complaint.  Patient presents with left hallux medial border ingrown painful to touch is progressive gotten worse worse with ambulation as pressure patient would like to have it removed she has not seen MRIs prior to seeing me pain scale 7 out of 10 dull aching nature   Review of Systems: Negative except as noted in the HPI. Denies N/V/F/Ch.  Past Medical History:  Diagnosis Date   COPD (chronic obstructive pulmonary disease) (HCC)    on chest xray   Dysrhythmia    PVCs   GERD (gastroesophageal reflux disease)    Hypercholesteremia    Hypertension    Irregular heart beat    Kidney stones    Osteoporosis    Wears dentures    full upper and lower    Current Outpatient Medications:    amLODipine (NORVASC) 5 MG tablet, Take 5 mg by mouth daily., Disp: , Rfl:    apixaban (ELIQUIS) 5 MG TABS tablet, Take 5 mg by mouth 2 (two) times daily., Disp: , Rfl:    aspirin 81 MG tablet, Take 81 mg by mouth daily., Disp: , Rfl:    Cholecalciferol (VITAMIN D-3 PO), Take by mouth., Disp: , Rfl:    Cyanocobalamin (VITAMIN B-12 PO), Take by mouth., Disp: , Rfl:    diazepam (VALIUM) 5 MG tablet, Take 5 mg by mouth every 6 (six) hours as needed for anxiety., Disp: , Rfl:    fluticasone (FLONASE) 50 MCG/ACT nasal spray, Place 1 spray into both nostrils daily. , Disp: , Rfl:    furosemide (LASIX) 20 MG tablet, Take 20 mg by mouth. , Disp: , Rfl:    metoprolol succinate (TOPROL-XL) 25 MG 24 hr tablet, Take by mouth., Disp: , Rfl:    mometasone (ELOCON) 0.1 % lotion, APPLY TO ITCHY EARS TWICE DAILY FOR 12 DAYS. MAY REPEAT AS NEEDED FOR ITCHY EARS, Disp: , Rfl:    Omega-3 Fatty Acids (OMEGA 3 PO), Take by mouth., Disp: , Rfl:    ondansetron (ZOFRAN-ODT) 4 MG disintegrating tablet, Take 1 tablet (4 mg total) by  mouth every 8 (eight) hours as needed for nausea or vomiting., Disp: 12 tablet, Rfl: 0   potassium chloride (KLOR-CON) 8 MEQ tablet, Take 8 mEq by mouth daily as needed., Disp: , Rfl:   Social History   Tobacco Use  Smoking Status Former   Current packs/day: 0.00   Average packs/day: 1 pack/day for 20.0 years (20.0 ttl pk-yrs)   Types: Cigarettes   Start date: 10/11/1974   Quit date: 10/11/1994   Years since quitting: 29.2  Smokeless Tobacco Never    Allergies  Allergen Reactions   Zetia [Ezetimibe] Other (See Comments)    Muscle pain   Objective:  There were no vitals filed for this visit. There is no height or weight on file to calculate BMI. Constitutional Well developed. Well nourished.  Vascular Dorsalis pedis pulses palpable bilaterally. Posterior tibial pulses palpable bilaterally. Capillary refill normal to all digits.  No cyanosis or clubbing noted. Pedal hair growth normal.  Neurologic Normal speech. Oriented to person, place, and time. Epicritic sensation to light touch grossly present bilaterally.  Dermatologic Painful ingrowing nail at medial nail borders of the hallux nail left. No other open wounds. No skin lesions.  Orthopedic:  Normal joint ROM without pain or crepitus bilaterally. No visible deformities. No bony tenderness.   Radiographs: None Assessment:   1. Ingrown left big toenail    Plan:  Patient was evaluated and treated and all questions answered.  Ingrown Nail, left -Patient elects to proceed with minor surgery to remove ingrown toenail removal today. Consent reviewed and signed by patient. -Ingrown nail excised. See procedure note. -Educated on post-procedure care including soaking. Written instructions provided and reviewed. -Patient to follow up in 2 weeks for nail check.  Procedure: Excision of Ingrown Toenail Location: Left 1st toe medial nail borders. Anesthesia: Lidocaine 1% plain; 1.5 mL and Marcaine 0.5% plain; 1.5 mL, digital  block. Skin Prep: Betadine. Dressing: Silvadene; telfa; dry, sterile, compression dressing. Technique: Following skin prep, the toe was exsanguinated and a tourniquet was secured at the base of the toe. The affected nail border was freed, split with a nail splitter, and excised. Chemical matrixectomy was then performed with phenol and irrigated out with alcohol. The tourniquet was then removed and sterile dressing applied. Disposition: Patient tolerated procedure well. Patient to return in 2 weeks for follow-up.   No follow-ups on file.

## 2023-12-27 DIAGNOSIS — F411 Generalized anxiety disorder: Secondary | ICD-10-CM | POA: Diagnosis not present

## 2023-12-27 DIAGNOSIS — E7800 Pure hypercholesterolemia, unspecified: Secondary | ICD-10-CM | POA: Diagnosis not present

## 2023-12-27 DIAGNOSIS — I10 Essential (primary) hypertension: Secondary | ICD-10-CM | POA: Diagnosis not present

## 2023-12-27 DIAGNOSIS — R7303 Prediabetes: Secondary | ICD-10-CM | POA: Diagnosis not present

## 2023-12-27 DIAGNOSIS — I4891 Unspecified atrial fibrillation: Secondary | ICD-10-CM | POA: Diagnosis not present

## 2023-12-27 DIAGNOSIS — G4733 Obstructive sleep apnea (adult) (pediatric): Secondary | ICD-10-CM | POA: Diagnosis not present

## 2023-12-27 DIAGNOSIS — M810 Age-related osteoporosis without current pathological fracture: Secondary | ICD-10-CM | POA: Diagnosis not present

## 2024-01-17 DIAGNOSIS — E538 Deficiency of other specified B group vitamins: Secondary | ICD-10-CM | POA: Diagnosis not present

## 2024-02-17 DIAGNOSIS — E538 Deficiency of other specified B group vitamins: Secondary | ICD-10-CM | POA: Diagnosis not present

## 2024-02-23 NOTE — Telephone Encounter (Signed)
 Patient has not been seen this year and has no upcoming appointment with our office. Will wait to run benefits until patient or provider requests them to be ran

## 2024-03-13 DIAGNOSIS — Z1331 Encounter for screening for depression: Secondary | ICD-10-CM | POA: Diagnosis not present

## 2024-03-13 DIAGNOSIS — Z8673 Personal history of transient ischemic attack (TIA), and cerebral infarction without residual deficits: Secondary | ICD-10-CM | POA: Diagnosis not present

## 2024-03-13 DIAGNOSIS — G5793 Unspecified mononeuropathy of bilateral lower limbs: Secondary | ICD-10-CM | POA: Diagnosis not present

## 2024-03-13 DIAGNOSIS — F1010 Alcohol abuse, uncomplicated: Secondary | ICD-10-CM | POA: Diagnosis not present

## 2024-03-20 DIAGNOSIS — E538 Deficiency of other specified B group vitamins: Secondary | ICD-10-CM | POA: Diagnosis not present

## 2024-03-21 ENCOUNTER — Telehealth: Payer: Self-pay | Admitting: Family Medicine

## 2024-03-21 NOTE — Telephone Encounter (Signed)
 Patient would like to get injection in her left knee. She is asking if there is time to get approval for injection she got last time before next week as she would like to get an injection before she leaves for beach the following week. Please advise.

## 2024-03-21 NOTE — Telephone Encounter (Signed)
 Please check benefits for Monovisc for LEFT knee OA.   Pt would like to try to get injection before leaving for vacation next week.   FYI - this did not require PA last time.

## 2024-03-21 NOTE — Telephone Encounter (Signed)
 Ran for Monovisc benefits Case [id number]

## 2024-03-22 NOTE — Telephone Encounter (Signed)
 Patient would like to be seen before leaving on her vacation next week    MONOVISC authorized for Left knee Deductible does not apply Copay $5 OOP $3400 has me $22.21 Once OOP has been met patient covered at 100% NO PRE CERT REQUIRED  Reference number [id number]

## 2024-03-22 NOTE — Telephone Encounter (Signed)
 Re-ran benefits with [employer]  Case number Case [id number]

## 2024-03-22 NOTE — Telephone Encounter (Signed)
Noted and scheduled 

## 2024-03-22 NOTE — Telephone Encounter (Signed)
 Benefits were ran but it states patients [employer] was terminated as of 10/11/2023?

## 2024-03-28 ENCOUNTER — Encounter: Payer: Self-pay | Admitting: Family Medicine

## 2024-03-28 ENCOUNTER — Ambulatory Visit: Admitting: Family Medicine

## 2024-03-28 ENCOUNTER — Other Ambulatory Visit: Payer: Self-pay

## 2024-03-28 VITALS — BP 130/84 | HR 66 | Ht 66.0 in | Wt 213.0 lb

## 2024-03-28 DIAGNOSIS — G8929 Other chronic pain: Secondary | ICD-10-CM | POA: Diagnosis not present

## 2024-03-28 DIAGNOSIS — M1712 Unilateral primary osteoarthritis, left knee: Secondary | ICD-10-CM

## 2024-03-28 DIAGNOSIS — M25562 Pain in left knee: Secondary | ICD-10-CM | POA: Diagnosis not present

## 2024-03-28 NOTE — Patient Instructions (Addendum)
 Thank you for coming in today.   You received an injection today. Seek immediate medical attention if the joint becomes red, extremely painful, or is oozing fluid.   Information provided for Genicular Artery Embolization procedure.

## 2024-03-28 NOTE — Progress Notes (Signed)
 I, Miquel Amen, CMA acting as a scribe for Danielle Juniper, MD.  Karlene Overcast is a 89 y.o. female who presents to [facility] at [facility] today for exacerbation of her L knee pain. Pt was last seen by Dr. Alease Hunter on 09/29/23 and was given a L knee steroid injection. Last Monovisc injection, L knee, 09/20/23.  Today, pt reports having only a few weeks of relief after last Monovisc injection. Has beach trip coming up and would like injection to help with sx. Chronic swelling in B LE. Mechanical sx present. Taking tylenol  prn.   Dx imaging: 01/13/21 L knee XR 05/15/15 Bilat knee XR  Pertinent review of systems: No fevers or chills  Relevant historical information: COPD.  A-fib with anticoagulation.   Exam:  BP 130/84   Pulse 66   Ht 5' 6 (1.676 m)   Wt 213 lb (96.6 kg)   SpO2 94%   BMI 34.38 kg/m  General: Well Developed, well nourished, and in no acute distress.   MSK: Left knee moderate effusion normal-appearing otherwise reduced range of motion lacking full extension and flexion.   Lab and Radiology Results  Procedure: Real-time Ultrasound Guided Injection of left knee joint superior lateral patella space Device: Philips Affiniti 50G/GE Logiq Images permanently stored and available for review in PACS Verbal informed consent obtained.  Discussed risks and benefits of procedure. Warned about infection, bleeding, hyperglycemia damage to structures among others. Patient expresses understanding and agreement Time-out conducted.   Noted no overlying erythema, induration, or other signs of local infection.   Skin prepped in a sterile fashion.   Local anesthesia: Topical Ethyl chloride.   With sterile technique and under real time ultrasound guidance: 40 mg of Kenalog  and 2 mL of Marcaine  injected into knee joint. Fluid seen entering the joint capsule.   Completed without difficulty   Pain immediately resolved suggesting accurate placement of the medication.    Advised to call if fevers/chills, erythema, induration, drainage, or persistent bleeding.   Images permanently stored and available for review in the ultrasound unit.  Impression: Technically successful ultrasound guided injection.       Assessment and Plan: 89 y.o. female with left knee pain due to DJD.  Patient had a Monovisc hyaluronic acid injection about 6 months ago that really did not work.  She had a few weeks of benefit from a follow-up steroid injection.  She would like to proceed with a steroid injection today as she is leaving for the beach next week and wants to feel good for that.  Start injection should be quite helpful for this application.  However I do not expect to provide lasting relief.  Fundamentally we are running out of typical conservative management options for her knee arthritis related pain.  She is not a candidate for total knee replacement.  We did talk about genicular artery embolization about last time but she is worried that it may be too dangerous for her.  I think it would be very wise to have a consultation with the interventional radiologists to discuss her particulars for the embolization.  Specifically sedation and anticoagulation time.  That way she can have informed discussion with her cardiologist about actual risks and benefits of this medication.   PDMP not reviewed this encounter. Orders Placed This Encounter  Procedures   [place]  LIMITED JOINT SPACE STRUCTURES LOW LEFT(NO LINKED CHARGES)    Reason for Exam (SYMPTOM  OR DIAGNOSIS REQUIRED):   left knee pain / OA  Preferred imaging location?:    [facility]    Standing Status:   Future    Expiration Date:   03/28/2025    Scheduling Instructions:     Genicular Artery Embolization    Reason for Exam (SYMPTOM  OR DIAGNOSIS REQUIRED):   left knee pain / OA    Preferred imaging location?:   [facility]   No orders of the defined types were  placed in this encounter.    Discussed warning signs or symptoms. Please see discharge instructions. Patient expresses understanding.   The above documentation has been reviewed and is accurate and complete Danielle Ruiz, M.D.

## 2024-03-29 ENCOUNTER — Ambulatory Visit: Admitting: Family Medicine

## 2024-04-20 DIAGNOSIS — E538 Deficiency of other specified B group vitamins: Secondary | ICD-10-CM | POA: Diagnosis not present

## 2024-05-01 DIAGNOSIS — R300 Dysuria: Secondary | ICD-10-CM | POA: Diagnosis not present

## 2024-05-01 DIAGNOSIS — J019 Acute sinusitis, unspecified: Secondary | ICD-10-CM | POA: Diagnosis not present

## 2024-05-17 DIAGNOSIS — L820 Inflamed seborrheic keratosis: Secondary | ICD-10-CM | POA: Diagnosis not present

## 2024-05-17 DIAGNOSIS — Z85828 Personal history of other malignant neoplasm of skin: Secondary | ICD-10-CM | POA: Diagnosis not present

## 2024-05-17 DIAGNOSIS — D2262 Melanocytic nevi of left upper limb, including shoulder: Secondary | ICD-10-CM | POA: Diagnosis not present

## 2024-05-17 DIAGNOSIS — D2272 Melanocytic nevi of left lower limb, including hip: Secondary | ICD-10-CM | POA: Diagnosis not present

## 2024-05-17 DIAGNOSIS — D2261 Melanocytic nevi of right upper limb, including shoulder: Secondary | ICD-10-CM | POA: Diagnosis not present

## 2024-05-17 DIAGNOSIS — L538 Other specified erythematous conditions: Secondary | ICD-10-CM | POA: Diagnosis not present

## 2024-05-17 DIAGNOSIS — D225 Melanocytic nevi of trunk: Secondary | ICD-10-CM | POA: Diagnosis not present

## 2024-05-22 DIAGNOSIS — E538 Deficiency of other specified B group vitamins: Secondary | ICD-10-CM | POA: Diagnosis not present

## 2024-06-15 DIAGNOSIS — H6522 Chronic serous otitis media, left ear: Secondary | ICD-10-CM | POA: Diagnosis not present

## 2024-06-15 DIAGNOSIS — H6123 Impacted cerumen, bilateral: Secondary | ICD-10-CM | POA: Diagnosis not present

## 2024-06-15 DIAGNOSIS — H90A32 Mixed conductive and sensorineural hearing loss, unilateral, left ear with restricted hearing on the contralateral side: Secondary | ICD-10-CM | POA: Diagnosis not present

## 2024-06-21 DIAGNOSIS — I4891 Unspecified atrial fibrillation: Secondary | ICD-10-CM | POA: Diagnosis not present

## 2024-06-21 DIAGNOSIS — I493 Ventricular premature depolarization: Secondary | ICD-10-CM | POA: Diagnosis not present

## 2024-06-21 DIAGNOSIS — I10 Essential (primary) hypertension: Secondary | ICD-10-CM | POA: Diagnosis not present

## 2024-06-21 DIAGNOSIS — I890 Lymphedema, not elsewhere classified: Secondary | ICD-10-CM | POA: Diagnosis not present

## 2024-06-21 DIAGNOSIS — E7800 Pure hypercholesterolemia, unspecified: Secondary | ICD-10-CM | POA: Diagnosis not present

## 2024-06-21 DIAGNOSIS — R7303 Prediabetes: Secondary | ICD-10-CM | POA: Diagnosis not present

## 2024-06-21 DIAGNOSIS — Z23 Encounter for immunization: Secondary | ICD-10-CM | POA: Diagnosis not present

## 2024-06-21 DIAGNOSIS — R600 Localized edema: Secondary | ICD-10-CM | POA: Diagnosis not present

## 2024-06-21 DIAGNOSIS — J410 Simple chronic bronchitis: Secondary | ICD-10-CM | POA: Diagnosis not present

## 2024-06-22 DIAGNOSIS — E538 Deficiency of other specified B group vitamins: Secondary | ICD-10-CM | POA: Diagnosis not present

## 2024-06-27 ENCOUNTER — Other Ambulatory Visit: Payer: Self-pay | Admitting: Physician Assistant

## 2024-06-27 DIAGNOSIS — I10 Essential (primary) hypertension: Secondary | ICD-10-CM | POA: Diagnosis not present

## 2024-06-27 DIAGNOSIS — E7800 Pure hypercholesterolemia, unspecified: Secondary | ICD-10-CM | POA: Diagnosis not present

## 2024-06-27 DIAGNOSIS — Z0000 Encounter for general adult medical examination without abnormal findings: Secondary | ICD-10-CM | POA: Diagnosis not present

## 2024-06-27 DIAGNOSIS — Z1331 Encounter for screening for depression: Secondary | ICD-10-CM | POA: Diagnosis not present

## 2024-06-27 DIAGNOSIS — M810 Age-related osteoporosis without current pathological fracture: Secondary | ICD-10-CM | POA: Diagnosis not present

## 2024-06-27 DIAGNOSIS — I4891 Unspecified atrial fibrillation: Secondary | ICD-10-CM | POA: Diagnosis not present

## 2024-06-27 DIAGNOSIS — R7303 Prediabetes: Secondary | ICD-10-CM | POA: Diagnosis not present

## 2024-06-27 DIAGNOSIS — Z1231 Encounter for screening mammogram for malignant neoplasm of breast: Secondary | ICD-10-CM

## 2024-07-16 DIAGNOSIS — H6982 Other specified disorders of Eustachian tube, left ear: Secondary | ICD-10-CM | POA: Diagnosis not present

## 2024-07-24 DIAGNOSIS — E538 Deficiency of other specified B group vitamins: Secondary | ICD-10-CM | POA: Diagnosis not present

## 2024-07-26 ENCOUNTER — Telehealth: Payer: Self-pay | Admitting: *Deleted

## 2024-07-26 NOTE — Telephone Encounter (Signed)
 Dr Joane referred pt for Lt knee gae. I have previously left messages, I called patient today and she states she is not ready to schedule she will c/b when ready.adin

## 2024-08-13 DIAGNOSIS — E538 Deficiency of other specified B group vitamins: Secondary | ICD-10-CM | POA: Diagnosis not present

## 2024-08-13 DIAGNOSIS — F1010 Alcohol abuse, uncomplicated: Secondary | ICD-10-CM | POA: Diagnosis not present

## 2024-08-13 DIAGNOSIS — I6381 Other cerebral infarction due to occlusion or stenosis of small artery: Secondary | ICD-10-CM | POA: Diagnosis not present

## 2024-08-13 DIAGNOSIS — G5793 Unspecified mononeuropathy of bilateral lower limbs: Secondary | ICD-10-CM | POA: Diagnosis not present

## 2024-08-13 DIAGNOSIS — Z8639 Personal history of other endocrine, nutritional and metabolic disease: Secondary | ICD-10-CM | POA: Diagnosis not present

## 2024-08-27 DIAGNOSIS — E538 Deficiency of other specified B group vitamins: Secondary | ICD-10-CM | POA: Diagnosis not present

## 2024-10-25 ENCOUNTER — Ambulatory Visit
Admission: RE | Admit: 2024-10-25 | Discharge: 2024-10-25 | Disposition: A | Discharge status: Home / Self Care | Source: Ambulatory Visit | Attending: Physician Assistant | Admitting: Physician Assistant

## 2024-10-25 DIAGNOSIS — Z1231 Encounter for screening mammogram for malignant neoplasm of breast: Secondary | ICD-10-CM | POA: Diagnosis present
